# Patient Record
Sex: Female | Born: 1960 | Race: Black or African American | Hispanic: No | Marital: Single | Smoking: Former smoker
Health system: Southern US, Community
[De-identification: ages and names within clinical notes are randomized; demographics above are authoritative.]

## PROBLEM LIST (undated history)

## (undated) DIAGNOSIS — D649 Anemia, unspecified: Secondary | ICD-10-CM

## (undated) DIAGNOSIS — K219 Gastro-esophageal reflux disease without esophagitis: Secondary | ICD-10-CM

## (undated) DIAGNOSIS — G43909 Migraine, unspecified, not intractable, without status migrainosus: Secondary | ICD-10-CM

## (undated) DIAGNOSIS — K5792 Diverticulitis of intestine, part unspecified, without perforation or abscess without bleeding: Secondary | ICD-10-CM

## (undated) DIAGNOSIS — F32A Depression, unspecified: Secondary | ICD-10-CM

## (undated) DIAGNOSIS — M1990 Unspecified osteoarthritis, unspecified site: Secondary | ICD-10-CM

## (undated) DIAGNOSIS — E079 Disorder of thyroid, unspecified: Secondary | ICD-10-CM

## (undated) DIAGNOSIS — I639 Cerebral infarction, unspecified: Secondary | ICD-10-CM

## (undated) DIAGNOSIS — I10 Essential (primary) hypertension: Secondary | ICD-10-CM

## (undated) DIAGNOSIS — F329 Major depressive disorder, single episode, unspecified: Secondary | ICD-10-CM

## (undated) HISTORY — DX: Gastro-esophageal reflux disease without esophagitis: K21.9

## (undated) HISTORY — DX: Migraine, unspecified, not intractable, without status migrainosus: G43.909

## (undated) HISTORY — DX: Essential (primary) hypertension: I10

## (undated) HISTORY — DX: Depression, unspecified: F32.A

## (undated) HISTORY — DX: Diverticulitis of intestine, part unspecified, without perforation or abscess without bleeding: K57.92

## (undated) HISTORY — DX: Cerebral infarction, unspecified: I63.9

## (undated) HISTORY — DX: Major depressive disorder, single episode, unspecified: F32.9

## (undated) HISTORY — DX: Anemia, unspecified: D64.9

## (undated) HISTORY — DX: Disorder of thyroid, unspecified: E07.9

## (undated) HISTORY — PX: NO PAST SURGERIES: SHX2092

---

## 2004-09-06 ENCOUNTER — Inpatient Hospital Stay: Payer: Self-pay | Admitting: Anesthesiology

## 2005-08-26 ENCOUNTER — Emergency Department: Payer: Self-pay | Admitting: Emergency Medicine

## 2005-12-02 ENCOUNTER — Emergency Department: Payer: Self-pay | Admitting: Emergency Medicine

## 2006-08-20 ENCOUNTER — Inpatient Hospital Stay: Payer: Self-pay | Admitting: Internal Medicine

## 2007-04-20 ENCOUNTER — Emergency Department: Payer: Self-pay | Admitting: Emergency Medicine

## 2007-11-06 ENCOUNTER — Other Ambulatory Visit: Payer: Self-pay

## 2007-11-06 ENCOUNTER — Inpatient Hospital Stay: Payer: Self-pay | Admitting: Internal Medicine

## 2008-01-22 ENCOUNTER — Emergency Department: Payer: Self-pay | Admitting: Emergency Medicine

## 2009-10-15 ENCOUNTER — Emergency Department: Payer: Self-pay | Admitting: Emergency Medicine

## 2010-01-31 ENCOUNTER — Inpatient Hospital Stay: Payer: Self-pay | Admitting: Psychiatry

## 2010-02-01 ENCOUNTER — Inpatient Hospital Stay: Payer: Self-pay | Admitting: Internal Medicine

## 2010-06-22 ENCOUNTER — Emergency Department: Payer: Self-pay | Admitting: Emergency Medicine

## 2010-09-02 ENCOUNTER — Inpatient Hospital Stay: Payer: Self-pay | Admitting: Psychiatry

## 2011-01-29 ENCOUNTER — Emergency Department: Payer: Self-pay | Admitting: Emergency Medicine

## 2012-12-08 ENCOUNTER — Emergency Department: Payer: Self-pay | Admitting: Emergency Medicine

## 2012-12-08 LAB — COMPREHENSIVE METABOLIC PANEL
Albumin: 4.3 g/dL (ref 3.4–5.0)
Alkaline Phosphatase: 112 U/L (ref 50–136)
Anion Gap: 11 (ref 7–16)
BUN: 10 mg/dL (ref 7–18)
Calcium, Total: 9.3 mg/dL (ref 8.5–10.1)
Chloride: 106 mmol/L (ref 98–107)
Co2: 23 mmol/L (ref 21–32)
EGFR (African American): >60
Glucose: 99 mg/dL (ref 65–99)
Osmolality: 278 (ref 275–301)
Potassium: 3.5 mmol/L (ref 3.5–5.1)
SGOT(AST): 29 U/L (ref 15–37)
SGPT (ALT): 27 U/L (ref 12–78)
Sodium: 140 mmol/L (ref 136–145)

## 2012-12-08 LAB — URINALYSIS, COMPLETE
Leukocyte Esterase: NEGATIVE
Nitrite: NEGATIVE
Ph: 5 (ref 4.5–8.0)
Protein: NEGATIVE
RBC,UR: 2 /HPF (ref 0–5)
Specific Gravity: 1.004 (ref 1.003–1.030)
Squamous Epithelial: NONE SEEN
WBC UR: 1 /HPF (ref 0–5)

## 2012-12-08 LAB — CBC
HCT: 47.2 % — ABNORMAL HIGH (ref 35.0–47.0)
MCHC: 32.8 g/dL (ref 32.0–36.0)
RDW: 15.5 % — ABNORMAL HIGH (ref 11.5–14.5)
WBC: 7.1 10*3/uL (ref 3.6–11.0)

## 2012-12-08 LAB — ETHANOL: Ethanol %: 0.287 % — ABNORMAL HIGH (ref 0.000–0.080)

## 2012-12-08 LAB — DRUG SCREEN, URINE
Amphetamines, Ur Screen: NEGATIVE (ref ?–1000)
Barbiturates, Ur Screen: NEGATIVE (ref ?–200)
Benzodiazepine, Ur Scrn: NEGATIVE (ref ?–200)
Cocaine Metabolite,Ur ~~LOC~~: POSITIVE (ref ?–300)
MDMA (Ecstasy)Ur Screen: NEGATIVE (ref ?–500)
Opiate, Ur Screen: NEGATIVE (ref ?–300)
Phencyclidine (PCP) Ur S: NEGATIVE (ref ?–25)
Tricyclic, Ur Screen: NEGATIVE (ref ?–1000)

## 2012-12-08 LAB — TSH: Thyroid Stimulating Horm: 4.66 u[IU]/mL — ABNORMAL HIGH

## 2013-05-06 LAB — CBC WITH DIFFERENTIAL/PLATELET
Eosinophil %: 1.6 %
HGB: 15.7 g/dL (ref 12.0–16.0)
MCH: 28.5 pg (ref 26.0–34.0)
MCHC: 34.3 g/dL (ref 32.0–36.0)
Monocyte #: 0.5 x10 3/mm (ref 0.2–0.9)
Monocyte %: 5.8 %
Neutrophil #: 4.4 10*3/uL (ref 1.4–6.5)
Platelet: 299 10*3/uL (ref 150–440)
RBC: 5.51 10*6/uL — ABNORMAL HIGH (ref 3.80–5.20)
RDW: 14 % (ref 11.5–14.5)

## 2013-05-06 LAB — PROTIME-INR
INR: 0.9
Prothrombin Time: 12.5 secs (ref 11.5–14.7)

## 2013-05-07 ENCOUNTER — Inpatient Hospital Stay: Payer: Self-pay | Admitting: Internal Medicine

## 2013-05-07 DIAGNOSIS — I6350 Cerebral infarction due to unspecified occlusion or stenosis of unspecified cerebral artery: Secondary | ICD-10-CM

## 2013-05-07 LAB — URINALYSIS, COMPLETE
Bacteria: NONE SEEN
Bilirubin,UR: NEGATIVE
Glucose,UR: NEGATIVE mg/dL (ref 0–75)
Leukocyte Esterase: NEGATIVE
Nitrite: NEGATIVE
Protein: NEGATIVE
RBC,UR: 3 /HPF (ref 0–5)
Specific Gravity: 1.012 (ref 1.003–1.030)
Squamous Epithelial: 1
WBC UR: 5 /HPF (ref 0–5)

## 2013-05-07 LAB — COMPREHENSIVE METABOLIC PANEL
Albumin: 3.9 g/dL (ref 3.4–5.0)
Anion Gap: 7 (ref 7–16)
BUN: 9 mg/dL (ref 7–18)
Bilirubin,Total: 0.5 mg/dL (ref 0.2–1.0)
Calcium, Total: 9.1 mg/dL (ref 8.5–10.1)
Co2: 28 mmol/L (ref 21–32)
EGFR (African American): >60
EGFR (Non-African Amer.): >60
Glucose: 101 mg/dL — ABNORMAL HIGH (ref 65–99)
Osmolality: 278 (ref 275–301)
Potassium: 3.1 mmol/L — ABNORMAL LOW (ref 3.5–5.1)

## 2013-05-07 LAB — DRUG SCREEN, URINE
Amphetamines, Ur Screen: NEGATIVE (ref ?–1000)
Benzodiazepine, Ur Scrn: NEGATIVE (ref ?–200)
MDMA (Ecstasy)Ur Screen: NEGATIVE (ref ?–500)
Methadone, Ur Screen: NEGATIVE (ref ?–300)
Opiate, Ur Screen: NEGATIVE (ref ?–300)

## 2013-05-07 LAB — SALICYLATE LEVEL: Salicylates, Serum: 2.1 mg/dL

## 2013-05-07 LAB — ETHANOL: Ethanol: 109 mg/dL

## 2013-05-08 ENCOUNTER — Inpatient Hospital Stay: Payer: Self-pay | Admitting: Psychiatry

## 2013-05-08 LAB — LIPID PANEL: Triglycerides: 246 mg/dL — ABNORMAL HIGH (ref 0–200)

## 2013-05-08 LAB — BASIC METABOLIC PANEL
Co2: 26 mmol/L (ref 21–32)
Creatinine: 0.6 mg/dL (ref 0.60–1.30)
EGFR (African American): >60
EGFR (Non-African Amer.): >60
Sodium: 140 mmol/L (ref 136–145)

## 2013-05-08 LAB — CBC WITH DIFFERENTIAL/PLATELET
Basophil #: 0 10*3/uL (ref 0.0–0.1)
Eosinophil %: 1.7 %
HCT: 38.8 % (ref 35.0–47.0)
HGB: 13.1 g/dL (ref 12.0–16.0)
Lymphocyte #: 1.7 10*3/uL (ref 1.0–3.6)
Lymphocyte %: 32.2 %
MCH: 28.2 pg (ref 26.0–34.0)
MCV: 84 fL (ref 80–100)
Monocyte #: 0.4 x10 3/mm (ref 0.2–0.9)
Neutrophil %: 57.3 %
RBC: 4.64 10*6/uL (ref 3.80–5.20)
RDW: 13.9 % (ref 11.5–14.5)

## 2013-10-31 ENCOUNTER — Inpatient Hospital Stay: Payer: Self-pay | Admitting: Internal Medicine

## 2013-10-31 LAB — URINALYSIS, COMPLETE
BLOOD: NEGATIVE
Bilirubin,UR: NEGATIVE
GLUCOSE, UR: NEGATIVE mg/dL (ref 0–75)
Ketone: NEGATIVE
NITRITE: NEGATIVE
PH: 5 (ref 4.5–8.0)
RBC,UR: 1 /HPF (ref 0–5)
Specific Gravity: 1.012 (ref 1.003–1.030)
WBC UR: 30 /HPF (ref 0–5)

## 2013-10-31 LAB — CBC
HCT: 42.7 % (ref 35.0–47.0)
HGB: 14.3 g/dL (ref 12.0–16.0)
MCH: 27.7 pg (ref 26.0–34.0)
MCHC: 33.5 g/dL (ref 32.0–36.0)
MCV: 83 fL (ref 80–100)
Platelet: 223 10*3/uL (ref 150–440)
RBC: 5.15 10*6/uL (ref 3.80–5.20)
RDW: 14.4 % (ref 11.5–14.5)
WBC: 11.4 10*3/uL — ABNORMAL HIGH (ref 3.6–11.0)

## 2013-10-31 LAB — COMPREHENSIVE METABOLIC PANEL
Albumin: 3.3 g/dL — ABNORMAL LOW (ref 3.4–5.0)
Alkaline Phosphatase: 117 U/L
Anion Gap: 8 (ref 7–16)
BUN: 10 mg/dL (ref 7–18)
Bilirubin,Total: 1.2 mg/dL — ABNORMAL HIGH (ref 0.2–1.0)
CALCIUM: 9.1 mg/dL (ref 8.5–10.1)
Chloride: 102 mmol/L (ref 98–107)
Co2: 28 mmol/L (ref 21–32)
Creatinine: 0.81 mg/dL (ref 0.60–1.30)
EGFR (African American): >60
Glucose: 115 mg/dL — ABNORMAL HIGH (ref 65–99)
OSMOLALITY: 276 (ref 275–301)
Potassium: 3 mmol/L — ABNORMAL LOW (ref 3.5–5.1)
SGOT(AST): 22 U/L (ref 15–37)
SGPT (ALT): 19 U/L (ref 12–78)
Sodium: 138 mmol/L (ref 136–145)
Total Protein: 7.3 g/dL (ref 6.4–8.2)

## 2013-10-31 LAB — LIPASE, BLOOD: Lipase: 201 U/L (ref 73–393)

## 2013-10-31 LAB — TROPONIN I: Troponin-I: <0.02 ng/mL

## 2013-11-01 LAB — CBC WITH DIFFERENTIAL/PLATELET
BASOS PCT: 0.6 %
Basophil #: 0 10*3/uL (ref 0.0–0.1)
Eosinophil #: 0.1 10*3/uL (ref 0.0–0.7)
Eosinophil %: 1.4 %
HCT: 40.7 % (ref 35.0–47.0)
HGB: 13.3 g/dL (ref 12.0–16.0)
LYMPHS PCT: 20.5 %
Lymphocyte #: 1.5 10*3/uL (ref 1.0–3.6)
MCH: 27.3 pg (ref 26.0–34.0)
MCHC: 32.6 g/dL (ref 32.0–36.0)
MCV: 84 fL (ref 80–100)
MONOS PCT: 7.3 %
Monocyte #: 0.5 x10 3/mm (ref 0.2–0.9)
Neutrophil #: 5.2 10*3/uL (ref 1.4–6.5)
Neutrophil %: 70.2 %
PLATELETS: 239 10*3/uL (ref 150–440)
RBC: 4.86 10*6/uL (ref 3.80–5.20)
RDW: 14.5 % (ref 11.5–14.5)
WBC: 7.4 10*3/uL (ref 3.6–11.0)

## 2013-11-01 LAB — URINE CULTURE

## 2014-01-01 DIAGNOSIS — I10 Essential (primary) hypertension: Secondary | ICD-10-CM | POA: Insufficient documentation

## 2014-01-16 ENCOUNTER — Ambulatory Visit: Payer: Self-pay

## 2014-01-16 DIAGNOSIS — R079 Chest pain, unspecified: Secondary | ICD-10-CM

## 2014-07-02 LAB — CBC AND DIFFERENTIAL
HCT: 40 % (ref 36–46)
Hemoglobin: 14.4 g/dL (ref 12.0–16.0)
Neutrophils Absolute: 4 /uL
Platelets: 264 10*3/uL (ref 150–399)
WBC: 6.6 10^3/mL

## 2014-07-02 LAB — LIPID PANEL
Cholesterol: 187 mg/dL (ref 0–200)
HDL: 50 mg/dL (ref 35–70)
Triglycerides: 401 mg/dL — AB (ref 40–160)

## 2014-09-05 DIAGNOSIS — G43909 Migraine, unspecified, not intractable, without status migrainosus: Secondary | ICD-10-CM | POA: Insufficient documentation

## 2014-10-18 NOTE — H&P (Signed)
PATIENT NAME:  Meredith Juarez, SACHS MR#:  [id number] DATE OF BIRTH:  1960/08/02  DATE OF ADMISSION:  05/07/2013  REFERRING PHYSICIAN: Dr.   PRIMARY CARE PHYSICIAN: None.   CHIEF COMPLAINT: Headache, not feeling well.   HISTORY OF PRESENT ILLNESS: This is a 54 year old female with significant past medical history of heavy alcohol abuse in the past, chronic anemia, tobacco abuse, who presents with above-mentioned complaints. The patient presents with alcohol intoxication, reports she drinks 40 ounces beer x 3 today. Reports her sister died recently, but she denies any suicidal thoughts or ideation, as well, she denies any heavy alcohol use. Reports she did not drink any alcohol for the last few months, even though she has history of alcohol abuse in the past and substance abuse, but she denies any alcohol or substance abuse over the last few months. The patient presents with complaints of posterior headache, blurry vision, weakness and dizziness. The patient can't give  reliable history and cannot tell exactly when is the onset of her symptoms. The patient was found to have right side weakness with the physical exam by ED physician to right upper and lower extremity. Motor strength five out of five with decreased sensation in the right lower extremity. The patient had CT head and CT head angiograms to rule out any acute CVA or  aneurysm. They did not show any acute findings. The patient denies any chest pain, any shortness of breath, any dysuria, polyuria, fever or chills. Reports she has not been seen by any physician. She is not taking any medications at home. Her blood pressure was on the higher side upon presentation,  systolic running in the 045W to 180s. Hospitalist service was requested to admit the patient for further management and work-up of the patient. The patient's blood work was significant for mild hypokalemia.   PAST MEDICAL HISTORY: 1. Tobacco abuse.  2. Alcohol intoxication.  3. Chronic  anemia.   PAST SURGICAL HISTORY: None.   FAMILY HISTORY: Mother had cancer, not sure what type. Father had coronary artery disease. Brother has schizophrenia, reports her sister who died, she is adopted and she does not know why she died.   SOCIAL HISTORY: The patient reports she is smoking 4 to 5 cigarettes every day, 1 pack last her every three days. Reports history of occasional alcohol use, but not regular. Reports the last drink she had prior to today was a few months ago, as well, denies any substance abuse.   HOME MEDICATIONS: The patient does not take any medications at home, does not follow with any primary care physician.   ALLERGIES: No known drug allergies.   REVIEW OF SYSTEMS:  CONSTITUTIONAL: The patient denies, fever or chills, weight loss, weight gain.  EYES: Denies double vision, inflammation, glaucoma, reports blurry vision.  EARS, NOSE, THROAT: Denies tinnitus, ear pain, hearing loss, epistaxis.  RESPIRATORY: Denies cough, wheezing, hemoptysis, dyspnea.  CARDIOVASCULAR: Denies chest pain, edema, arrhythmia, syncope.  GASTROINTESTINAL: Denies nausea, vomiting, diarrhea, abdominal pain, hematemesis.  GENITOURINARY: Denies dysuria, hematuria, renal colic.  ENDOCRINE: Denies polyuria, polydipsia, heat or cold intolerance.  HEMATOLOGY: Denies anemia, easy bruising, bleeding diathesis.  INTEGUMENT: Denies acne, rash or effusion.  MUSCULOSKELETAL: Denies any gout, cramps or arthritis.  NEUROLOGICAL: Denies any history of CVA, transient ischemic attack, seizures, memory loss, dementia, ataxia, vertigo. In the past, reports history of posterior headache, as well, right elbow and lower side weakness.  PSYCHIATRIC: Denies any suicidal thoughts any anxiety, insomnia or depression.   PHYSICAL EXAMINATION: VITAL SIGNS:  Temperature 98.3, pulse 86, respiratory rate 17, blood pressure 182/101, saturating 96% on room air.  GENERAL: Well-nourished female who looks comfortable in bed, in  no apparent distress.  HEENT: Head atraumatic, normocephalic. Pupils equal, reactive to light. Pink conjunctivae. Anicteric sclerae. Moist oral mucosa.  NECK: Supple. No thyromegaly. No JVD.  CHEST: Good air entry bilaterally. No wheezing, rales or rhonchi.   CARDIOVASCULAR: S1, S2 heard. No rubs, murmur or gallops.  ABDOMEN: Soft, nontender, nondistended. Bowel sounds present.  EXTREMITIES: She is intoxicated, but awake, alert x 3 and conversant.  NEUROLOGIC: Cranial nerves grossly intact. No nystagmus appreciated. Motor right upper and lower extremity, 4/5. Left 5/5. Sensation decreased in the right lower extremity. Reflexes are symmetric and +2 bilaterally.  LYMPHATICS: No cervical or supraclavicular lymphadenopathy.   PERTINENT LABORATORY DATA: Glucose 101, BUN 9, creatinine 0.83, sodium 140, potassium 3.1, chloride 105, CO2 28, ALT 29, AST 36, alkaline phosphatase 133, TSH 4.8. Troponin less than 0.02. Urine drugs negative. Ethanol percentage 0.109.   White blood cells 7.8, hemoglobin 15.7, hematocrit 45.8, platelets 199. Urinalysis negative for leukocyte esterase and nitrite.   Imaging: CT angiography head: No high-grade flow limiting stenosis or intracranial aneurysm identified. CT head without contrast showing normal examination unchanged.   ASSESSMENT AND PLAN: 1. Cerebrovascular accident. The patient who presents with right-sided weakness, she will be admitted for a cerebrovascular accident and work-up. She already received 324 of aspirin. We will check MRI brain in the morning, carotid Dopplers, 2-D echo. Admit to telemetry unit. Start subcutaneous heparin for deep vein thrombosis prophylaxis and physical therapy consult. She will be continued on 324 aspirin daily. We will check a lipid panel and consult neurology service. As well, for this patient we will allow for permissive hypertension. Systolic blood pressure can remain higher than 200 for the first 24 hours.  2. Hypokalemia. We  will replace. We will recheck tomorrow.  3. Tobacco abuse. The patient was counseled. Will be started on NicoDerm patches.  4. Alcohol intoxication. The patient reports she does not use alcohol chronically, last time before a few month, so doubt she will have any withdrawel , but she will be monitored closely for that.  5. Deep vein thrombosis prophylaxis. Subcutaneous heparin.  6. Diet. The patient will be assessed by ED nurse for swallow and if she passes we will resume her on diet.  7. CODE STATUS: Full code.   TOTAL TIME SPENT FOR ADMISSION AND PATIENT CARE: 55 minutes.    ____________________________ Albertine Patricia, MD dse:sg D: 05/07/2013 05:08:00 ET T: 05/07/2013 06:16:26 ET JOB#: [id number]  cc: Albertine Patricia, MD, <Dictator> Lennart Gladish Graciela Husbands MD ELECTRONICALLY SIGNED 06/03/2013 2:32

## 2014-10-18 NOTE — Consult Note (Signed)
PATIENT NAMELATRESHA, YAHR [id number] OF BIRTH:  1961/05/09 OF ADMISSION:  11/10/2014OF CONSULTATION:  05/07/2013 PHYSICIAN: Dr. Emeline Gins Elgergawy  ACONSULTINGTTENDING PHYSICIAN: Dr. Orson Slick  FOR CONSULTATION: To assess a patient with alcoholism.  DATA: [name] is a 54 year old female with history of alcoholism and mood instability.   COMPLAINT: "I am depressed. "   HISTORY OF PRESENT ILLNESS: Ms.Meredith Juarez was admitted for right sided weakness with suspicion of CVA. He has a history of depression and alcoholism. Her blood alcohol level was elevated on admission. She was placed on the CIWA protocol. The extend of her drinking is unknown. She reports being sober for several months until 3 days ago when she relapsed on beer drinking 40 oz beers for the past 3 days. She has been more depressed lately as she just lost her adoptive sister with whom she was very close. She has a history of noncompliance with medications and doctors appointments. She reports poor sleep, decreased appetite, low energy, crying, anhedonia, social isolation, feelings of guilt, hopelessness, helplessness, poor memory and concentration. She has been having passing suicidal thoughts since her sister passed away.  There is no psychotic symptoms or symptoms suggestive of bipolar mania. There are no other than alcohol substances involved. PSYCHIATRIC HISTORY: She was diagnosed with depression in 20111. There was a suicide attempt by painkiller overdose in 2012. She has been drinking since. Twice this year she was admitted to [facility] for alcohol treatment.    PSYCHIATRIC HISTORY: Brother with schizophrenia who died of an overdose.   MEDICAL HISTORY: Hypertension, CVA.  ON ADMISSION: None.  HISTORY: She has not been employed since 2011. She worked for a [employer] and was let go after a work-related injury when a roof fell on top of her on the job.  She lives with her mother and does not feel that this is a good arrangement.  She has never been married.  OF SYSTEMS: No fevers or chills. Positive for weight loss. No double or blurred vision. No hearing loss. No shortness of breath or cough. No chest pain or orthopnea. No abdominal pain, nausea, vomiting, or diarrhea. No incontinence or frequency. No heat or cold intolerance. No anemia or easy bruising. No acne or rash. No muscle or joint pain. No tingling or weakness. See history of present illness for details.  EXAMINATION: SIGNS: Blood pressure 144/89, pulse 75, respirations 18, temperature 98.2. GENERAL: This is an obese female in no acute distress. The rest of the physical examibation is deferred to her primary attending.   DATA: Chemistries are within normal limits except for potassium of 3.1. Blood alcohol level is 0.109. LFTs within normal limits. TSH 4.8. UDS is negative for substances. CBC within normal limits. Urinalysis is not suggestive of urinary tract infection.   STATUS EXAMINATION: The patient is alert and oriented to person, place, time, and situation. She is pleasant, polite and cooperative. She is fairly groomed. She maintains good eye contact. Her speech is slow. Mood is depressed with flat affect. Thought process is logical. Thought content: She denies thoughts of hurting herselfor others. There are no delusions or paranoia. There are no auditory or visual hallucinations. Her cognition is grossly intact. Her insight and judgment are poor. I:   Major depressive disorder, recurrent, severe.  Alcohol abuse. AXIS II:  Deferred. III:  HTN, CVA, Status post work-related injury. IV:  Mental illness, substance abuse, primary support, employment, financial, housing. V:  GAF on admission 35.   1. Please continue CIWA protocol. Will start Trazodone  for sleep. She may need transfer to psychiatry once medically cleared.     Electronic Signatures: Orson Slick (MD) (Signed on 11-Nov-14 05:23)  Authored   Last Updated: 12-Nov-14 04:56 by Orson Slick (MD)

## 2014-10-18 NOTE — Consult Note (Signed)
Brief Consult Note: Diagnosis: Mood disorder NOS, alcohol abuse.   Patient was seen by consultant.   Consult note dictated.   Recommend further assessment or treatment.   Orders entered.   Comments: Ms. Swartzentruber has a h/o depression and alcoholism. She denies steady drinking. In the past she was treated with a mood stabilizer. She denies suicidal or homicidal ideation.She has been depressed lately due to multiple loses here for CVA.  PLAN: 1. Please continue CIWA protocol.  2. Will start Trazodone for sleep.  3. She may need transfer to psychiatry once medically cleared.  Electronic Signatures: Orson Slick (MD)  (Signed [phone] 18:04)  Authored: Brief Consult Note   Last Updated: 10-Nov-14 18:04 by Orson Slick (MD)

## 2014-10-18 NOTE — Discharge Summary (Signed)
PATIENT NAME:  ENA, DEMARY MR#:  [id number] DATE OF BIRTH:  1961/04/19  DATE OF ADMISSION:  05/07/2013 DATE OF DISCHARGE:  05/08/2013  PRIMARY CARE PHYSICIAN:  Nonlocal.  CONSULTING PHYSICIAN:  Dr. Manuella Ghazi, neurology; Dr. Bary Leriche, behavior medicine physician.    DISCHARGE DIAGNOSES:  Transient ischemic attack, hypertension, obesity, major depression and suicidal ideation.   CONDITION: Stable.   CODE STATUS: Full code.   HOME MEDICATIONS: Please refer to the medication reconciliation list. Zocor 40 mg p.o. at bedtime, aspirin 81 mg p.o. daily, nicotine patch 21 mg per 24 hour transdermal film 1 patch transdermal once a day, HCTZ 25 mg p.o. daily.   DIET: Low sodium, low fat, low cholesterol diet.   ACTIVITY: As tolerated.   FOLLOWUP CARE: The patient will be discharged to Behavior Medicine Unit today.   REASON FOR ADMISSION: Headache, not feeling well.   HOSPITAL COURSE: The patient is a 54 year old African American female with a history of heavy alcohol abuse, chronic anemia, tobacco abuse, presented in the ED with headache and not feeling well. In addition, the patient complains of right-sided weakness and decreased sensation, but CAT scan of head and angiogram did not show any acute process. The patient's blood pressure was 160 to 180s. For detailed history and physical examination, please refer to the admission note dictated by Dr. Waldron Labs. Laboratory data on admission date showed WBC 7.8, hemoglobin 15.7, platelets 199. Urinalysis negative  The patient was admitted for CVA due to this right-sided weakness. After admission, the patient still complains of right-sided weakness. The patient has been treated with aspirin 325 mg p.o. daily with a statin. The patient was not given hypertension medication since systolic blood pressure should be maintained higher. The patient got a carotid duplex and echocardiograph which were normal. In addition, the patient got an MRI of the brain which  did not show any acute stroke. The patient's right-sided weakness has been much better today. The patient only complains of headache. Since the patient's blood pressure is elevated at 160s, we gave hypertension medication.  Blood pressure decreased to normal range. This morning, the patient has complaints of suicidal ideations, so she is put on one-to-one seizure. Dr. Bary Leriche evaluated the patient yesterday, suggesting the patient has major depression and needs transfer to Behavior Medicine Unit if the patient is medically stable. Since the patient has no evidence of acute CVA, her blood pressure is under control, she is clinically stable and will be discharged to Behavior Medicine Unit today. I discussed the patient's discharge plan with the patient, nurse and case manager.   TIME SPENT: About 38 minutes.   ____________________________ Demetrios Loll, MD qc:cs D: 05/08/2013 13:16:27 ET T: 05/08/2013 14:06:21 ET JOB#: [id number]  cc: Demetrios Loll, MD, <Dictator> Demetrios Loll MD ELECTRONICALLY SIGNED 05/10/2013 16:02

## 2014-10-18 NOTE — Consult Note (Signed)
Brief Consult Note: Diagnosis: Mood disorder NOS, alcohol abuse.   Patient was seen by consultant.   Consult note dictated.   Recommend further assessment or treatment.   Orders entered.   Comments: [name] has a h/o depression and alcoholism. She denies steady drinking. In the past she was treated with a mood stabilizer. She denies suicidal or homicidal ideation.She has been depressed lately due to multiple loses here for CVA.  PLAN: 1. Transfer to psychiatry.  2. Please continue CIWA protocol.  3. Will start Trazodone for sleep.  Electronic Signatures: Orson Slick (MD)  (Signed 11-Nov-14 11:17)  Authored: Brief Consult Note   Last Updated: 11-Nov-14 11:17 by Orson Slick (MD)

## 2014-10-18 NOTE — Consult Note (Signed)
PATIENT NAME:  Meredith Juarez, Meredith Juarez MR#:  [id number] DATE OF BIRTH:  1960/08/06  DATE OF CONSULTATION:  05/07/2013  REFERRING PHYSICIAN:  Albertine Patricia, MD CONSULTING PHYSICIAN:  Meredith Noguera K. Manuella Ghazi, MD  REASON FOR CONSULTATION: Concern for stroke, right-sided weakness.   HISTORY OF PRESENT ILLNESS: Meredith Juarez is a 54 year old right-handed African American female who had subacute onset of right-sided weakness since around 05/06/2013. The patient could not tell me exactly what time of the day her weakness started. The patient came to the ER due to the high blood pressure and possible headache into the back of her head.   The patient also carries the diagnosis of alcoholism, mentioned she had a recent death in her family and was drinking very heavily. She noted to have high alcohol level during her ER visit. The ER physician noted that her subjective complaint of right-sided weakness was very hard to prove on objective exam.   PAST MEDICAL HISTORY: Significant for tobacco abuse, alcoholism and chronic anemia.   PAST SURGICAL HISTORY: Negative.   FAMILY HISTORY: Positive for her mother had cancer. Father had coronary artery disease. Brother has schizophrenia. Sister has died.   SOCIAL HISTORY: Significant that she smokes 4 to 5 cigarettes a day. She has intermittent heavy alcohol use. She is currently unemployed.   MEDICATIONS:  She does not take any medications at home.   ALLERGIES:  She does not have any known drug allergies.   REVIEW OF SYSTEMS: Positive for feeling of pain and numbness in the right upper and lower extremities as well as pain with passive movement of her arm and legs. She also has a feeling of weakness on her arm and leg. The other 10-system review of system was asked and was found to be negative except feeling of anxiety and depression.     PHYSICAL EXAMINATION: VITAL SIGNS: Temperature 98.5, pulse 77, respiratory rate 18, blood pressure 165/100, pulse ox 97% on room air.   GENERAL: She is a middle-aged [place] American female lying in bed, not in acute distress, watching TV.  LUNGS: Clear to auscultation.  HEART: S1, S2 heart sounds. Carotid exam did not reveal any bruit.  EYES:  Funduscopic exam was attempted.  ABDOMEN:  Her belly is soft.  MENTAL STATUS EXAM: She was alert, oriented followed 2-step commands. She was shy and she had poor eye contact. She said her mood is okay. She seemed a little bit upset while turning off the TV. The patient's language seems to be intact. She does not have any neurological neglect. Her attention and concentration seems to be appropriate as well as fund of knowledge.  NEUROLOGICAL EXAMINATION:  Cranial nerves: Her pupils are equal, round and reactive. Extraocular movements are intact. Her visual fields seem full. Her face was symmetric. Facial sensations were intact. Her tongue was midline. Palate was upgoing. Her hearing was intact. Her neck and shoulder shrug seems to be okay. On her motor exam, the patient has pain with passive movement of her right upper and lower extremity. She did some resistance to passive movement. It was hard to check pronator drift as she will not pronate her right arm really well and it was painful when I tried to rotate it passively. The patient does have poor effort on her right upper and lower extremity but when did the spontaneous movement of her arm it did move really well. I did not see any change in the tone on her right upper and lower extremity. Her sensations were also decreased  in the right upper and lower extremities. Reflexes were symmetric. I could not check her gait as patient felt uncomfortable getting up.     ASSESSMENT AND PLAN: 1.  Right-sided upper and lower extremity weakness, concern for ischemic infarct with negative CT scan of the head as well as CT angiogram. I agree with doing MRI of the brain without contrast looking for cytotoxic edema. 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 The patient is going through a significant rough time with a family member's death as well as alcoholism, etc. In either case, physical therapy does help with right-sided weakness so should be pursued. The patient was not a candidate for tPA as the time of onset was not very clear. The patient had a swallow evaluation. The patient should be continued on aspirin and statin. I talked to the patient about the risk factors of stroke including uncontrolled high blood pressure, alcoholism, hyperlipidemia, etc. The patient should keep an eye open for obstructive sleep apnea. The patient is being monitored to make sure she does not have paroxysmal atrial fibrillation. Also check her A1c. I reviewed her carotid Doppler which has been unremarkable as well as her echo showed concentric hypertrophy and some diastolic dysfunction.  2.  Alcoholism per psychiatry.   Feel free to contact me with any further questions. I will follow this patient with you.   ____________________________ Meredith Ryden K. Manuella Ghazi, MD hks:cs D: 05/07/2013 20:14:52 ET T: 05/07/2013 20:46:49 ET JOB#: [id number]  cc: Meredith Wierenga K. Manuella Ghazi, MD, <Dictator> Meredith Juarez [facility] MD ELECTRONICALLY SIGNED 05/11/2013 12:34

## 2014-10-19 ENCOUNTER — Emergency Department
Admit: 2014-10-19 | Disposition: A | Discharge status: Home / Self Care | Payer: Self-pay | Admitting: Emergency Medicine

## 2014-10-19 NOTE — H&P (Signed)
PATIENT NAME:  Meredith Juarez, Meredith Juarez MR#:  [id number] DATE OF BIRTH:  25-Jun-1961  DATE OF ADMISSION:  10/31/2013  PRIMARY CARE PHYSICIAN: None.  CHIEF COMPLAINT: Left lower abdominal pain for three days.   HISTORY OF PRESENT ILLNESS: Meredith Juarez is a 54 year old African American female with past medical history of cardiomyopathy, history of menorrhagia, PTSD, major depression with psychosis, comes to the Emergency Room after she started having discomfort and pain in her left lower abdomen for the last three days. She has history of constipation in the past however, has regular bowel movement every day without any bloody stools. She denies any vomiting, fever. The patient also had presyncopal episode likely due to her abdominal pain prior to arrival. She is hemodynamically stable. Tells me her blood pressure is very labile. She does not follow any primary care physician on a regular basis due to insurance reasons. She takes hydrochlorothiazide; however, is running out of her medication for the last three several days. Work-up in the Emergency Room showed she has acute sigmoid diverticulitis. She received IV Cipro and p.o. Flagyl. She is being admitted for further evaluation and management. White count is 11,000.   PAST MEDICAL HISTORY: 1. Major depression with psychosis.  2. PTSD.  3. History of fibroid uterus with menorrhagia leading to anemia.  4. Recto/anal hemorrhage with GI bleed.  5. History of cardiomyopathy.   MEDICATIONS: The patient takes:  1. Seroquel 50 mg at bedtime.  2. Prozac 20 mg daily.  3. Topiramate 100 mg p.o. daily.  4. Aspirin 81 mg daily.  5. Hydrochlorothiazide 25 mg p.o. daily.   ALLERGIES: No known drug allergies.   SOCIAL HISTORY: Smokes every now and then. She denies any alcohol or any drug use. Lives with a friend. She has applied for disability.   FAMILY HISTORY: Mother had cancer, not sure what type. Father had coronary artery disease. Brother has schizophrenia.    REVIEW OF SYSTEMS:  CONSTITUTIONAL: No fever, fatigue or weakness.  EYES: No blurred or double vision, glaucoma or cataracts.  ENT: Denies tinnitus, ear pain, hearing loss, epistaxis.  RESPIRATORY: No cough, wheeze, hemoptysis, dyspnea.  CARDIOVASCULAR: No chest pain, orthopnea, edema.  GASTROINTESTINAL: No nausea, vomiting. Positive for abdominal pain. No melena or rectal bleed.  GENITOURINARY: No dysuria, hematuria, renal colic.  ENDOCRINE: No polyuria, nocturia, polydipsia, heat or cold intolerance.  HEMATOLOGY: No easy bruising or bleeding disorder. No anemia.  SKIN: No acne, rash or any lesions.  MUSCULOSKELETAL: Positive for back pain. No gout or swelling of joints.  NEUROLOGIC: No CVA, TIA, seizures, dementia. Positive for migraine headaches in the past.  PSYCHIATRIC: Positive for major depression. Denies any suicidal thoughts. All other systems reviewed and negative.   PHYSICAL EXAMINATION: GENERAL: The patient is awake, alert, oriented x 3, not in acute distress.  VITAL SIGNS: Afebrile. Pulse is 91, blood pressure is 168/94, sats are 99% on room air.  HEENT: Atraumatic, normocephalic. Pupils: PERRLA. EOM intact. Oral mucosa is moist.  NECK: Supple. No JVD. No carotid bruit.  LUNGS: Clear to auscultation bilaterally. No rales, rhonchi, respiratory distress or labored breathing.  HEART: Both the heart sounds are normal. Tachycardia present. No murmur heard. PMI not lateralized. Chest is nontender.  EXTREMITIES: Good pedal pulses, good femoral pulses. No lower extremity edema.  ABDOMEN: Soft. There is mild tenderness in the right lower quadrant abdomen. No guarding, rigidity or any mass felt. Good  bowel sounds.  NEUROLOGIC: Grossly intact cranial nerves II through XII. No motor or sensory deficit.  PSYCHIATRIC: The patient is awake, alert, oriented x 3.   CT of the abdomen and pelvis shows sigmoid diverticulitis. No perforation or abscess. multi fibroid uterus.   Ptotic left  kidney.   Urinalysis positive for mild urinary tract infection. White count is 11.4.   Electrolytes within normal limits except potassium of 3.0. Troponin is 0.02. EKG shows normal sinus rhythm.   ASSESSMENT: A 54 year old Meredith Juarez with history of major depression with psychosis, hypertension, history of headaches, comes to the Emergency Room after she had presyncopal episode suspected secondary to abdominal pain. She was found to have:   1. Acute sigmoid diverticulitis. We will keep patient on clear liquid diet. Give her IV fluids. Continue Cipro, Flagyl. Advance diet as tolerated.  2. Hypertension. Continue hydrochlorothiazide. Monitor blood pressure and add other antihypertensives as needed.  3. History of major depression with psychosis. The patient is on Prozac, trazodone and Seroquel which were resumed.  4. History of headaches. The patient takes Topamax 100 mg at bedtime.  5. Deep vein thrombosis prophylaxis. Subcutaneous heparin t.i.d.  6. Further work-up per the patient's clinical course.   Hospital admission plan was discussed with the patient. No family members present.   TIME SPENT: 45 minutes.   ____________________________ Meredith Rochester Posey Pronto, MD sap:sg D: 10/31/2013 07:52:22 ET T: 10/31/2013 11:05:03 ET JOB#: [id number]  cc: Meredith Fulbright A. Posey Pronto, MD, <Dictator>  Meredith Basset MD ELECTRONICALLY SIGNED 11/18/2013 16:16

## 2014-10-19 NOTE — Discharge Summary (Signed)
PATIENT NAME:  Meredith, Juarez MR#:  [id number] DATE OF BIRTH:  24-Jul-1960  DATE OF ADMISSION:  10/31/2013 DATE OF DISCHARGE:  11/01/2013  PRESENTING COMPLAINT: Abdominal pain.   DISCHARGE DIAGNOSES: 1.  Acute sigmoid diverticulitis.  2.  Hypertension.  3.  Depression.   CONDITION ON DISCHARGE: Fair.   MEDICATIONS: 1.  Topamax 100 mg at bedtime.  2.  Aspirin 81 mg daily.  3.  Hydrochlorothiazide 25 mg daily.  4.  Prozac 20 mg daily.  5.  Trazodone 100 mg t.i.d.  6.  Seroquel XR 150 extended release p.o. daily.  7.  Hydrochlorothiazide 50 mg daily.  8.  Flagyl 500 mg t.i.d.  9.  Cipro 500 b.i.d.  10.  Tylenol 325 mg 2 tablets every 4 hours as needed.  11.  Acetaminophen/hydrocodone  1 every 6 hours as needed.   Follow up with [facility].   LABORATORY DATA:  White count at discharge was 7.4.   BRIEF SUMMARY OF HOSPITAL COURSE:  Meredith Juarez is a 54 year old African American female with history of hypertension and depression, who comes in to the Emergency Room with complaints of abdominal pain. She was admitted with acute sigmoid diverticulitis as seen on CT of the abdomen. She was kept on clear liquids, continued her Cipro and Flagyl. Her white count was 11,000 on admission; that came down to 7000 at discharge. The patient was asked to take full liquid diet for a couple of days and then soft diet thereafter.  2.  Hypertension. Was placed on hydrochlorothiazide and beta blockers.  3. History of depression with major psychosis. The patient was asked to continue her Prozac, trazodone and Seroquel.    4.  History of chronic headaches; takes Topamax for prevention.  5.  Deep vein thrombosis prophylaxis with subQ heparin.   Hospital stay otherwise remained stable.   The patient remained a FULL CODE.   TIME SPENT:  40 minutes.   ____________________________ Hart Rochester Posey Pronto, MD sap:dmm D: 11/02/2013 07:20:32 ET T: 11/02/2013 10:08:51 ET JOB#: [id number]  cc: Ansel Ferrall A. Posey Pronto, MD,  <Dictator> Ilda Basset MD ELECTRONICALLY SIGNED 11/18/2013 16:16

## 2014-11-05 NOTE — H&P (Signed)
PATIENT NAME:  Meredith, Juarez [id number] OF BIRTH:  Aug 17, 1960 OF ADMISSION:  05/08/2013 PHYSICIAN: Dr. Emeline Gins Elgergawy PHYSICIAN: Dr. Orson Slick  DATA: Meredith Juarez is a 54 year old female with a history of alcoholism and mood instability.   COMPLAINT: "Why should I live when everybody around me is dying."   HISTORY OF PRESENT ILLNESS: Meredith Juarez was briefly hospitalized on medical floor for right sided weakness with suspicion of CVA that was ruled out based on MRI. Her blood alcohol level on admission was elevated and, given history of alcoholism, she was started on the CIWA protocol.  At the time of discharge from medicine, the patient reported suicidal thoughts and was transferred to psychiatry to complete alcohol detox and treat depression. Meredith Juarez experienced several major loses lately including the death of her adoptive sister, with whom she was very close, just few days ago. She reports poor sleep, decreased appetite, low energy, crying, anhedonia, social isolation, feelings of guilt, hopelessness, helplessness, poor memory and concentration and frequent suicidal thoughts since her sister passed away. She relapsed on alcohol and has been drinking 40 oz beers prior to admission. Her blood alcohol level was 0.109. She denies psychotic symptoms or symptoms suggestive of bipolar mania. She denies illicit drugs or prescription pill abuse.  PSYCHIATRIC HISTORY: She was diagnosed with depression in 2011. There was a suicide attempt by painkiller overdose in 2012. She was tried on several antidepressants and Tegretol but has been treatment noncompliant due to expense concurrent alcohol use. There is a long history of drinking that escalated after 2012. Twice this year she was admitted to [facility] for alcohol detox.     PSYCHIATRIC HISTORY: Brother with schizophrenia who died of an overdose.   MEDICAL HISTORY: Hypertension, CVA.  ON ADMISSION: ASA 81 mg, HCTZ 25 mg daily, Zocor 40 mg daily.   HISTORY:  She has not been employed since 2011. She worked for a [employer] and was let go after a work-related injury when a roof fell on top of her on the job.  She lives with her mother. She has never been married.  OF SYSTEMS: No fevers or chills. Positive for weight loss. No double or blurred vision. No hearing loss. No shortness of breath or cough. No chest pain or orthopnea. No abdominal pain, nausea, vomiting, or diarrhea. No incontinence or frequency. No heat or cold intolerance. No anemia or easy bruising. No acne or rash. No muscle or joint pain. Positive for right sided weakness that is now resolving. See history of present illness for details.  EXAMINATION: SIGNS: Blood pressure 137/92, pulse 84, respirations 20, temperature 98.3. GENERAL: This is an obese female in no acute distress.  HEENT: The pupils are equal, round, and reactive to light. Sclerae anicteric. Supple. No thyromegaly. Clear to auscultation. No dullness to percussion. Regular rhythm and rate. No murmurs, rubs, or gallops. Soft, nontender, nondistended. Positive bowel sounds. Normal muscle strength in all extremities. No rashes or bruises. No cervical adenopathy. Cranial nerves II through XII are intact.   DATA: Chemistries are within normal limits. Blood alcohol level on admission was 0.109. LFTs within normal limits. TSH 4.8. UDS is negative for substances. CBC within normal limits. Urinalysis is not suggestive of urinary tract infection.   STATUS EXAMINATION ON ADMISSION: The patient is alert and oriented to person, place, time, and situation. She is pleasant, polite and cooperative. She is well groomed wearing a hospital gown. She maintains good eye contact. Her speech is slow. There is poverty of speech. Mood is  depressed with flat affect. Thought process is logical and goal oriented. Thought content: She reports frequent thought of wanting to die. There are no thoughts of hurting others. There are no delusions or paranoia. There  are no auditory or visual hallucinations. Her cognition is grossly intact. Her insight and judgment are poor.  SUICIDE RISK ASSESSMENT ON ADMISSION: This is a patient with a history of depression, mood instability, suicide attempt and alcoholism who relapsed on alcohol in the context of recent major loss. She is at increased risk of suicide.  DIAGNOSES:I:   Major depressive disorder, recurrent, severe.  Alcohol dependence.  AXIS II:  Deferred. III:  HTN, CVA, Status post work-related injury. IV:  Mental illness, major loss, substance abuse, primary support, employment, financial. V:  GAF on admission 25.  The patient was admitted to [place] unit for safety, stabilization and medication management. She was initially placed on suicide precautions and was closely monitored for any unsafe behaviors. She underwent full psychiatric and risk assessment. She received pharmacotherapy, individual and group psychotherapy, substance abuse counseling, and support from therapeutic milieu.    Suicidal ideation: The patient is able to contract for safety.    Alcohol detox: She was started on the CIWA protocol on medical floor. We will continue with a brief Ativan taper.     Mood: There is a history of treatment noncompliance. The patient does not believe that medications have been useful. We will offer Trazodone for sleep.    4.  Substance abuse treatment: The patient is not interested in residential treatment.  Disposition: She will be discharged to home.   Electronic Signatures: Orson Slick (MD)  (Signed on 12-Nov-14 05:47)  Authored  Last Updated: 12-Nov-14 05:47 by Orson Slick (MD)

## 2014-11-28 ENCOUNTER — Encounter (INDEPENDENT_AMBULATORY_CARE_PROVIDER_SITE_OTHER): Payer: Self-pay

## 2015-01-02 ENCOUNTER — Other Ambulatory Visit: Payer: Self-pay

## 2015-01-09 ENCOUNTER — Ambulatory Visit: Payer: Self-pay

## 2015-01-21 ENCOUNTER — Ambulatory Visit: Payer: Self-pay

## 2015-04-15 ENCOUNTER — Other Ambulatory Visit: Payer: Self-pay

## 2015-04-17 ENCOUNTER — Other Ambulatory Visit: Payer: Self-pay

## 2015-04-22 ENCOUNTER — Ambulatory Visit: Payer: Self-pay

## 2015-04-24 ENCOUNTER — Ambulatory Visit: Payer: Self-pay

## 2015-04-24 ENCOUNTER — Other Ambulatory Visit: Payer: Self-pay

## 2015-05-01 ENCOUNTER — Ambulatory Visit: Payer: Self-pay

## 2015-05-08 ENCOUNTER — Other Ambulatory Visit: Payer: Self-pay

## 2015-05-20 ENCOUNTER — Other Ambulatory Visit: Payer: Self-pay

## 2015-05-20 LAB — BASIC METABOLIC PANEL
BUN: 16 mg/dL (ref 4–21)
CREATININE: 0.7 mg/dL (ref 0.5–1.1)
GLUCOSE: 117 mg/dL
POTASSIUM: 3.6 mmol/L (ref 3.4–5.3)
SODIUM: 140 mmol/L (ref 137–147)

## 2015-05-20 LAB — HEPATIC FUNCTION PANEL
ALK PHOS: 77 U/L (ref 25–125)
ALT: 16 U/L (ref 7–35)
AST: 17 U/L (ref 13–35)
BILIRUBIN, TOTAL: 0.5 mg/dL

## 2015-05-20 LAB — HEMOGLOBIN A1C: Hemoglobin A1C: 6.1

## 2015-05-20 LAB — TSH: TSH: 1.45 u[IU]/mL (ref 0.41–5.90)

## 2015-10-13 ENCOUNTER — Other Ambulatory Visit: Payer: Self-pay | Admitting: Urology

## 2015-10-17 DIAGNOSIS — I10 Essential (primary) hypertension: Secondary | ICD-10-CM

## 2015-10-17 DIAGNOSIS — R079 Chest pain, unspecified: Secondary | ICD-10-CM

## 2015-10-17 DIAGNOSIS — M722 Plantar fascial fibromatosis: Secondary | ICD-10-CM

## 2015-10-17 DIAGNOSIS — G43909 Migraine, unspecified, not intractable, without status migrainosus: Secondary | ICD-10-CM

## 2016-04-01 ENCOUNTER — Encounter: Payer: Self-pay | Admitting: Pharmacist

## 2016-06-14 ENCOUNTER — Ambulatory Visit: Payer: Self-pay | Admitting: Pharmacist

## 2016-06-14 ENCOUNTER — Encounter: Payer: Self-pay | Admitting: Pharmacist

## 2016-06-14 ENCOUNTER — Encounter (INDEPENDENT_AMBULATORY_CARE_PROVIDER_SITE_OTHER): Payer: Self-pay

## 2016-06-14 VITALS — BP 122/80

## 2016-06-14 NOTE — Progress Notes (Signed)
  [facility] Visit Note  Patient: Meredith Juarez MRN: [id number] Date of Birth: 07-28-1960 PCP: No primary care provider on file.   [name] 55 y.o. female presents for an annual medication review visit today. She is not feeling well today; states she has a cold. We briefly reviewed her medications although she admits to not being able to pronounce the names. She did not bring her medications or a list.  BP 122/80 (BP Location: Left Arm, Patient Position: Sitting)   Patient Information   Past Medical History:  Diagnosis Date  . Anemia   . Depression   . Diverticulitis   . GERD (gastroesophageal reflux disease)   . Hypertension   . Migraines   . Stroke ([place])   . Thyroid disease      History reviewed. No pertinent surgical history.   Family History  Problem Relation Age of Onset  . Cancer Mother     Uterine, Breast, Ovarian  . Hypertension Mother   . Hypertension Father   . Bipolar disorder Father   . CAD Sister   . Hyperlipidemia Sister     New Diagnoses (since last visit):   Family Support: Comments:Information not available  Lifestyle Diet: Breakfast: Lunch: Dinner: Drinks:            History  Alcohol Use No      History  Smoking Status  . Current Every Day Smoker  . Packs/day: 0.25  . Types: Cigarettes  Smokeless Tobacco  . Never Used      Health Maintenance  Topic Date Due  . Hepatitis C Screening  09-May-1961  . HIV Screening  03/02/1976  . TETANUS/TDAP  03/02/1980  . PAP SMEAR  03/02/1982  . MAMMOGRAM  03/03/2011  . COLONOSCOPY  03/03/2011  . INFLUENZA VACCINE  01/27/2016     Assessment and Plan: Ms. Gasparyan appeared unsure of what medications she is taking.  She did not have a list of her medications or the bottles. [facility] has not filled her amlodipine, potassium, clopidogrel, hydrochlorothiazide or metoprolol since 10/14/15 (90 day supply).   Today (after the visit) she presented new  prescriptions to the pharmacy for quetiapine 100 mg at bedtime (previously on 50 mg ER daily), trazodone 100 mg- 1/2 to 1 tablet at night as needed and sertraline 100 mg daily (previously on 50 mg). She will be following up with Dr. Jamse Arn in January.  She complains of being "sleepy" and "dizzy" all of the time. States she fell a few weeks ago. She is not sure if this is related to her medications.  Patient has asked that I contact [facility] to reestablish primary care.  I will update the AVS and reprint. She will be picking up the prescriptions tomorrow or later this week.    Josephanthony Tindel K. Dicky Doe, PharmD [facility] [place] Operations Coordinator [phone]

## 2016-06-15 ENCOUNTER — Emergency Department
Admission: EM | Admit: 2016-06-15 | Discharge: 2016-06-15 | Disposition: A | Discharge status: Home / Self Care | Payer: Self-pay | Attending: Emergency Medicine | Admitting: Emergency Medicine

## 2016-06-15 DIAGNOSIS — F1410 Cocaine abuse, uncomplicated: Secondary | ICD-10-CM

## 2016-06-15 DIAGNOSIS — F331 Major depressive disorder, recurrent, moderate: Secondary | ICD-10-CM

## 2016-06-15 DIAGNOSIS — F17210 Nicotine dependence, cigarettes, uncomplicated: Secondary | ICD-10-CM | POA: Insufficient documentation

## 2016-06-15 DIAGNOSIS — Z79899 Other long term (current) drug therapy: Secondary | ICD-10-CM | POA: Insufficient documentation

## 2016-06-15 DIAGNOSIS — F10129 Alcohol abuse with intoxication, unspecified: Secondary | ICD-10-CM | POA: Insufficient documentation

## 2016-06-15 DIAGNOSIS — F10920 Alcohol use, unspecified with intoxication, uncomplicated: Secondary | ICD-10-CM

## 2016-06-15 DIAGNOSIS — F4325 Adjustment disorder with mixed disturbance of emotions and conduct: Secondary | ICD-10-CM

## 2016-06-15 DIAGNOSIS — E876 Hypokalemia: Secondary | ICD-10-CM

## 2016-06-15 DIAGNOSIS — R45851 Suicidal ideations: Secondary | ICD-10-CM

## 2016-06-15 DIAGNOSIS — I10 Essential (primary) hypertension: Secondary | ICD-10-CM | POA: Insufficient documentation

## 2016-06-15 LAB — URINALYSIS, COMPLETE (UACMP) WITH MICROSCOPIC
BILIRUBIN URINE: NEGATIVE
Bacteria, UA: NONE SEEN
GLUCOSE, UA: NEGATIVE mg/dL
Hgb urine dipstick: NEGATIVE
KETONES UR: NEGATIVE mg/dL
LEUKOCYTES UA: NEGATIVE
NITRITE: NEGATIVE
PH: 6 (ref 5.0–8.0)
Protein, ur: NEGATIVE mg/dL
RBC / HPF: NONE SEEN RBC/hpf (ref 0–5)
SPECIFIC GRAVITY, URINE: 1.001 — AB (ref 1.005–1.030)
Squamous Epithelial / LPF: NONE SEEN
WBC, UA: NONE SEEN WBC/hpf (ref 0–5)

## 2016-06-15 LAB — URINE DRUG SCREEN, QUALITATIVE (ARMC ONLY)
Amphetamines, Ur Screen: NOT DETECTED
BARBITURATES, UR SCREEN: NOT DETECTED
BENZODIAZEPINE, UR SCRN: NOT DETECTED
Cannabinoid 50 Ng, Ur ~~LOC~~: NOT DETECTED
Cocaine Metabolite,Ur ~~LOC~~: POSITIVE — AB
MDMA (Ecstasy)Ur Screen: NOT DETECTED
METHADONE SCREEN, URINE: NOT DETECTED
Opiate, Ur Screen: NOT DETECTED
Phencyclidine (PCP) Ur S: NOT DETECTED
TRICYCLIC, UR SCREEN: NOT DETECTED

## 2016-06-15 LAB — COMPREHENSIVE METABOLIC PANEL
ALT: 18 U/L (ref 14–54)
ANION GAP: 14 (ref 5–15)
AST: 24 U/L (ref 15–41)
Albumin: 4.1 g/dL (ref 3.5–5.0)
Alkaline Phosphatase: 73 U/L (ref 38–126)
BUN: 6 mg/dL (ref 6–20)
CHLORIDE: 101 mmol/L (ref 101–111)
CO2: 25 mmol/L (ref 22–32)
Calcium: 9.4 mg/dL (ref 8.9–10.3)
Creatinine, Ser: 0.59 mg/dL (ref 0.44–1.00)
Glucose, Bld: 81 mg/dL (ref 65–99)
POTASSIUM: 2.5 mmol/L — AB (ref 3.5–5.1)
Sodium: 140 mmol/L (ref 135–145)
Total Bilirubin: 0.7 mg/dL (ref 0.3–1.2)
Total Protein: 7.4 g/dL (ref 6.5–8.1)

## 2016-06-15 LAB — CBC WITH DIFFERENTIAL/PLATELET
BASOS ABS: 0.1 10*3/uL (ref 0–0.1)
Basophils Relative: 1 %
EOS PCT: 1 %
Eosinophils Absolute: 0.1 10*3/uL (ref 0–0.7)
HCT: 45.9 % (ref 35.0–47.0)
Hemoglobin: 16 g/dL (ref 12.0–16.0)
LYMPHS PCT: 27 %
Lymphs Abs: 2.8 10*3/uL (ref 1.0–3.6)
MCH: 29.2 pg (ref 26.0–34.0)
MCHC: 34.8 g/dL (ref 32.0–36.0)
MCV: 83.8 fL (ref 80.0–100.0)
MONO ABS: 0.5 10*3/uL (ref 0.2–0.9)
Monocytes Relative: 5 %
Neutro Abs: 6.8 10*3/uL — ABNORMAL HIGH (ref 1.4–6.5)
Neutrophils Relative %: 66 %
PLATELETS: 282 10*3/uL (ref 150–440)
RBC: 5.48 MIL/uL — ABNORMAL HIGH (ref 3.80–5.20)
RDW: 15.2 % — ABNORMAL HIGH (ref 11.5–14.5)
WBC: 10.3 10*3/uL (ref 3.6–11.0)

## 2016-06-15 LAB — ETHANOL: ALCOHOL ETHYL (B): 227 mg/dL — AB (ref <5)

## 2016-06-15 LAB — ACETAMINOPHEN LEVEL

## 2016-06-15 LAB — SALICYLATE LEVEL

## 2016-06-15 LAB — POTASSIUM: Potassium: 3.4 mmol/L — ABNORMAL LOW (ref 3.5–5.1)

## 2016-06-15 MED ORDER — POTASSIUM CHLORIDE ER 10 MEQ PO TBCR: 10.0000 meq | EXTENDED_RELEASE_TABLET | Freq: Every day | ORAL | 0 refills | Status: DC

## 2016-06-15 MED ORDER — LORAZEPAM 2 MG PO TABS: 0.0000 mg | ORAL_TABLET | Freq: Four times a day (QID) | ORAL | Status: DC

## 2016-06-15 MED ORDER — VITAMIN B-1 100 MG PO TABS
100.0000 mg | ORAL_TABLET | Freq: Every day | ORAL | Status: DC
  Administered 2016-06-15: 100 mg via ORAL
  Filled 2016-06-15: qty 1

## 2016-06-15 MED ORDER — POTASSIUM CHLORIDE CRYS ER 20 MEQ PO TBCR
60.0000 meq | EXTENDED_RELEASE_TABLET | Freq: Once | ORAL | Status: AC
  Administered 2016-06-15: 60 meq via ORAL
  Filled 2016-06-15: qty 3

## 2016-06-15 NOTE — ED Provider Notes (Signed)
[facility] Emergency Department Provider Note   ____________________________________________   First MD Initiated Contact with Patient 06/15/16 0024     (approximate)  I have reviewed the triage vital signs and the nursing notes.   HISTORY  Chief Complaint Suicidal    HPI Meredith Juarez is a 55 y.o. female brought to the ED from home via EMS with a chief complaint of depression with suicidal ideation. Patient found her friend dead yesterday, presumably from a cardiac arrest. Reports she has had several tests and her family recently. Admits to suicidal ideation without plan. Has a history of depression. Extremely tearful and distraught. Voices no medical complaints. Specifically, denies recent fever, chills, chest pain, shortness of breath, abdominal pain, nausea, vomiting, diarrhea. Denies recent travel trauma. Nothing makes her symptoms better or worse.   Past Medical History:  Diagnosis Date  . Anemia   . Depression   . Diverticulitis   . GERD (gastroesophageal reflux disease)   . Hypertension   . Migraines   . Stroke ([place])   . Thyroid disease     Patient Active Problem List   Diagnosis Date Noted  . Migraines 09/05/2014  . Plantar fasciitis 09/05/2014  . Chest pain 01/01/2014  . Hypertension 01/01/2014    History reviewed. No pertinent surgical history.  Prior to Admission medications   Medication Sig Start Date End Date Taking? Authorizing Provider  acetaminophen (TYLENOL) 500 MG tablet Take 500 mg by mouth every 4 (four) hours as needed.    Historical Provider, MD  amLODipine (NORVASC) 5 MG tablet TAKE ONE TABLET BY MOUTH EVERY DAY 10/13/15   Nori Riis, PA-C  aspirin EC 81 MG tablet Take 81 mg by mouth daily.    Historical Provider, MD  clopidogrel (PLAVIX) 75 MG tablet TAKE ONE TABLET BY MOUTH EVERY DAY. 10/13/15   Nori Riis, PA-C  hydrochlorothiazide (HYDRODIURIL) 25 MG tablet TAKE ONE TABLET BY MOUTH EVERY DAY.  10/13/15   Nori Riis, PA-C  metoprolol tartrate (LOPRESSOR) 25 MG tablet TAKE ONE TABLET BY MOUTH EVERY DAY. 10/13/15   Shannon A McGowan, PA-C  NITROSTAT 0.4 MG SL tablet DISSOLVE 1 TABLET UNDER TONGUE EVERY 5 MINUTES FOR CHEST PAIN. MAX 3 DOSES. IF NO RELIEF AFTER 1st DOSE, CALL 911. 10/13/15   Nori Riis, PA-C  potassium chloride SA (K-DUR,KLOR-CON) 20 MEQ tablet TAKE 1 TABLET BY MOUTH EVERY DAY 10/13/15   Nori Riis, PA-C  QUEtiapine (SEROQUEL) 100 MG tablet Take 100 mg by mouth at bedtime.    Floria Raveling, MD  ranitidine (ZANTAC) 150 MG tablet TAKE ONE TABLET BY MOUTH 2 TIMES A DAY. 10/13/15   Nori Riis, PA-C  sertraline (ZOLOFT) 100 MG tablet Take 100 mg by mouth daily.    Floria Raveling, MD  traZODone (DESYREL) 100 MG tablet Take 100 mg by mouth at bedtime. 1/2 - 1 tablet as needed    Floria Raveling, MD    Allergies Patient has no known allergies.  Family History  Problem Relation Age of Onset  . Cancer Mother     Uterine, Breast, Ovarian  . Hypertension Mother   . Hypertension Father   . Bipolar disorder Father   . CAD Sister   . Hyperlipidemia Sister     Social History Social History  Substance Use Topics  . Smoking status: Current Every Day Smoker    Packs/day: 0.25    Types: Cigarettes  . Smokeless tobacco: Never Used  . Alcohol use No  Review of Systems  Constitutional: No fever/chills. Eyes: No visual changes. ENT: No sore throat. Cardiovascular: Denies chest pain. Respiratory: Denies shortness of breath. Gastrointestinal: No abdominal pain.  No nausea, no vomiting.  No diarrhea.  No constipation. Genitourinary: Negative for dysuria. Musculoskeletal: Negative for back pain. Skin: Negative for rash. Neurological: Negative for headaches, focal weakness or numbness. Psychiatric:Positive for depression with SI.  10-point ROS otherwise negative.  ____________________________________________   PHYSICAL EXAM:  VITAL SIGNS: ED Triage  Vitals  Enc Vitals Group     BP      Pulse      Resp      Temp      Temp src      SpO2      Weight      Height      Head Circumference      Peak Flow      Pain Score      Pain Loc      Pain Edu?      Excl. in [place]?     Constitutional: Alert and oriented. Disheveled appearing and in moderate acute distress. Eyes: Conjunctivae are normal. PERRL. EOMI. Head: Atraumatic. Nose: No congestion/rhinnorhea. Mouth/Throat: Mucous membranes are moist.  Oropharynx non-erythematous. Neck: No stridor.   Cardiovascular: Normal rate, regular rhythm. Grossly normal heart sounds.  Good peripheral circulation. Respiratory: Normal respiratory effort.  No retractions. Lungs CTAB. Gastrointestinal: Soft and nontender. No distention. No abdominal bruits. No CVA tenderness. Musculoskeletal: No lower extremity tenderness nor edema.  No joint effusions. Neurologic:  Normal speech and language. No gross focal neurologic deficits are appreciated. No gait instability. Skin:  Skin is warm, dry and intact. No rash noted. Psychiatric: Mood and affect are tearful, histrionic and upset. Speech and behavior are normal.  ____________________________________________   LABS (all labs ordered are listed, but only abnormal results are displayed)  Labs Reviewed  CBC WITH DIFFERENTIAL/PLATELET - Abnormal; Notable for the following:       Result Value   RBC 5.48 (*)    RDW 15.2 (*)    Neutro Abs 6.8 (*)    All other components within normal limits  COMPREHENSIVE METABOLIC PANEL - Abnormal; Notable for the following:    Potassium 2.5 (*)    All other components within normal limits  ACETAMINOPHEN LEVEL - Abnormal; Notable for the following:    Acetaminophen (Tylenol), Serum <10 (*)    All other components within normal limits  ETHANOL - Abnormal; Notable for the following:    Alcohol, Ethyl (B) 227 (*)    All other components within normal limits  URINE DRUG SCREEN, QUALITATIVE ([facility] ONLY) - Abnormal; Notable  for the following:    Cocaine Metabolite,Ur [place] POSITIVE (*)    All other components within normal limits  URINALYSIS, COMPLETE (UACMP) WITH MICROSCOPIC - Abnormal; Notable for the following:    Color, Urine COLORLESS (*)    APPearance CLEAR (*)    Specific Gravity, Urine 1.001 (*)    All other components within normal limits  SALICYLATE LEVEL   ____________________________________________  EKG  None ____________________________________________  RADIOLOGY  None ____________________________________________   PROCEDURES  Procedure(s) performed: None  Procedures  Critical Care performed: No  ____________________________________________   INITIAL IMPRESSION / ASSESSMENT AND PLAN / ED COURSE  Pertinent labs & imaging results that were available during my care of the patient were reviewed by me and considered in my medical decision making (see chart for details).  55 year old female who presents with depression and suicidal ideation after  finding her friend died yesterday. Even though she does not have an active plan for suicide, she is extremely emotional and distraught and repeatedly voicing suicidal thoughts. Will place patient under involuntary commitment for her safety. Patient declines offer for Ativan at this time. Will consult TTS and psychiatry to evaluate patient in the emergency department.  Clinical Course as of Jun 15 614  Tue Jun 15, 2016  0614 No events overnight. Patient will remain in the emergency department under involuntary commitment pending psychiatry disposition.  [JS]    Clinical Course User Index [JS] Paulette Blanch, MD     ____________________________________________   FINAL CLINICAL IMPRESSION(S) / ED DIAGNOSES  Final diagnoses:  Suicidal thoughts  Moderate episode of recurrent major depressive disorder ([place])  Cocaine abuse  Alcoholic intoxication without complication ([place])  Hypokalemia      NEW MEDICATIONS STARTED DURING THIS  VISIT:  New Prescriptions   No medications on file     Note:  This document was prepared using Dragon voice recognition software and may include unintentional dictation errors.    Paulette Blanch, MD 06/15/16 [phone]

## 2016-06-15 NOTE — ED Notes (Signed)
BEHAVIORAL HEALTH ROUNDING Patient sleeping: Yes Patient alert and oriented: not applicable Behavior appropriate: Yes.  ; If no, describe:  Nutrition and fluids offered: Yes  Toileting and hygiene offered: No Sitter present: not applicable Law enforcement present: Yes  

## 2016-06-15 NOTE — ED Notes (Signed)
BEHAVIORAL HEALTH ROUNDING Patient sleeping: No. Patient alert and oriented: yes Behavior appropriate: Yes.  ; If no, describe:  Nutrition and fluids offered: Yes  Toileting and hygiene offered: Yes  Sitter present: not applicable Law enforcement present: Yes  

## 2016-06-15 NOTE — Consult Note (Signed)
[facility] Face-to-Face Psychiatry Consult   Reason for Consult:  Consult for 55 year old woman who came to the emergency room last night intoxicated and reporting suicidal thoughts. Referring Physician:  Cinda Quest Patient Identification: Meredith Juarez MRN:  [id number] Principal Diagnosis: Adjustment disorder with mixed disturbance of emotions and conduct Diagnosis:   Patient Active Problem List   Diagnosis Date Noted  . Suicidal ideation [R45.851] 06/15/2016  . Alcohol abuse [F10.10] 06/15/2016  . Adjustment disorder with mixed disturbance of emotions and conduct [F43.25] 06/15/2016  . Hypokalemia [E87.6] 06/15/2016  . Migraines [G43.909] 09/05/2014  . Plantar fasciitis [M72.2] 09/05/2014  . Chest pain [R07.9] 01/01/2014  . Hypertension [I10] 01/01/2014    Total Time spent with patient: 1 hour  Subjective:   Meredith Juarez is a 55 y.o. female patient admitted with "I found a friend of mine dead".  HPI:  Patient interviewed. Chart reviewed. Patient tells me that yesterday afternoon she found a friend of hers dead. She had been visiting him and found him in his bathroom apparently having had a heart attack. Obviously she was upset about this. Admits that she went drinking last night. Tells me that she only had "a little" to drink but her blood alcohol level was 227 when she presented here. She was endorsing thoughts of suicide last night but had not acted on it. Said her mood was feeling upset anxious and depressed. She claims that prior to the events of yesterday however her mood had been fine. She had not been feeling depressed or having suicidal thoughts and had been compliant with her medication. Said she had been staying off alcohol for a while. Denies having any hallucinations. This morning she is requesting to go home but she still looks pretty fatigued and a little.  Medical history: Patient also presented with hypokalemia when she came in. She received oral potassium replacement and that  has improved. Has a history of some chronic pain high blood pressure and migraines.  Social history: Says she is living with a friend. Not working regularly. Goes to [facility] for outpatient treatment.  Substance abuse history: Established history of alcohol abuse. No history of DTs or seizures. Has abused other drugs. She denied to me but her drug screen is positive for cocaine.  Past Psychiatric History: Patient does have a past history of suicide attempts. She has had a past history of psychiatric hospitalization. Currently says she is a client at [facility] and takes Seroquel Zoloft and trazodone.  Risk to Self: Suicidal Ideation: No-Not Currently/Within Last 6 Months Suicidal Intent: No-Not Currently/Within Last 6 Months Is patient at risk for suicide?: No Suicidal Plan?: No Access to Means: No What has been your use of drugs/alcohol within the last 12 months?: Reports use of alcohol, evidence of cocaine use How many times?: 2 Other Self Harm Risks: denied Triggers for Past Attempts: Unknown Intentional Self Injurious Behavior: None Risk to Others: Homicidal Ideation: No Thoughts of Harm to Others: No Current Homicidal Intent: No Current Homicidal Plan: No Access to Homicidal Means: No Identified Victim: None identified History of harm to others?: No Assessment of Violence: None Noted Violent Behavior Description: n/a Does patient have access to weapons?: No Criminal Charges Pending?: No Does patient have a court date: No Prior Inpatient Therapy: Prior Inpatient Therapy: No (Patient refused to answer) Prior Therapy Dates:  (Unknown) Prior Therapy Facilty/Provider(s):  (Unknown) Reason for Treatment: Unknown Prior Outpatient Therapy: Prior Outpatient Therapy: Yes Prior Therapy Dates: Current Prior Therapy Facilty/Provider(s): [facility]-Dr. Jamse Arn Reason for Treatment: Depression  Does patient have an ACCT team?: No Does patient have Intensive In-House Services?  : No Does patient have [facility]  services? : No Does patient have P4CC services?: No  Past Medical History:  Past Medical History:  Diagnosis Date  . Anemia   . Depression   . Diverticulitis   . GERD (gastroesophageal reflux disease)   . Hypertension   . Migraines   . Stroke ([place])   . Thyroid disease    History reviewed. No pertinent surgical history. Family History:  Family History  Problem Relation Age of Onset  . Cancer Mother     Uterine, Breast, Ovarian  . Hypertension Mother   . Hypertension Father   . Bipolar disorder Father   . CAD Sister   . Hyperlipidemia Sister    Family Psychiatric  History: Family history positive for depression and substance abuse Social History:  History  Alcohol Use No     History  Drug Use No    Social History   Social History  . Marital status: Single    Spouse name: N/A  . Number of children: N/A  . Years of education: N/A   Social History Main Topics  . Smoking status: Current Every Day Smoker    Packs/day: 0.25    Types: Cigarettes  . Smokeless tobacco: Never Used  . Alcohol use No  . Drug use: No  . Sexual activity: Not Asked   Other Topics Concern  . None   Social History Narrative  . None   Additional Social History:    Allergies:  No Known Allergies  Labs:  Results for orders placed or performed during the hospital encounter of 06/15/16 (from the past 48 hour(s))  CBC with Differential     Status: Abnormal   Collection Time: 06/15/16 12:44 AM  Result Value Ref Range   WBC 10.3 3.6 - 11.0 K/uL   RBC 5.48 (H) 3.80 - 5.20 MIL/uL   Hemoglobin 16.0 12.0 - 16.0 g/dL   HCT 45.9 35.0 - 47.0 %   MCV 83.8 80.0 - 100.0 fL   MCH 29.2 26.0 - 34.0 pg   MCHC 34.8 32.0 - 36.0 g/dL   RDW 15.2 (H) 11.5 - 14.5 %   Platelets 282 150 - 440 K/uL   Neutrophils Relative % 66 %   Neutro Abs 6.8 (H) 1.4 - 6.5 K/uL   Lymphocytes Relative 27 %   Lymphs Abs 2.8 1.0 - 3.6 K/uL   Monocytes Relative 5 %   Monocytes Absolute 0.5 0.2 - 0.9 K/uL   Eosinophils  Relative 1 %   Eosinophils Absolute 0.1 0 - 0.7 K/uL   Basophils Relative 1 %   Basophils Absolute 0.1 0 - 0.1 K/uL  Comprehensive metabolic panel     Status: Abnormal   Collection Time: 06/15/16 12:44 AM  Result Value Ref Range   Sodium 140 135 - 145 mmol/L   Potassium 2.5 (LL) 3.5 - 5.1 mmol/L    Comment: CRITICAL RESULT CALLED TO, READ BACK BY AND VERIFIED WITH CALLED IRIS GUIDRY AT 8250 ON 06/15/16 BY SNJ    Chloride 101 101 - 111 mmol/L   CO2 25 22 - 32 mmol/L   Glucose, Bld 81 65 - 99 mg/dL   BUN 6 6 - 20 mg/dL   Creatinine, Ser 0.59 0.44 - 1.00 mg/dL   Calcium 9.4 8.9 - 10.3 mg/dL   Total Protein 7.4 6.5 - 8.1 g/dL   Albumin 4.1 3.5 - 5.0 g/dL   AST 24  15 - 41 U/L   ALT 18 14 - 54 U/L   Alkaline Phosphatase 73 38 - 126 U/L   Total Bilirubin 0.7 0.3 - 1.2 mg/dL   GFR calc non Af Amer >60 >60 mL/min   GFR calc Af Amer >60 >60 mL/min    Comment: (NOTE) The eGFR has been calculated using the CKD EPI equation. This calculation has not been validated in all clinical situations. eGFR's persistently <60 mL/min signify possible Chronic Kidney Disease.    Anion gap 14 5 - 15  Acetaminophen level     Status: Abnormal   Collection Time: 06/15/16 12:44 AM  Result Value Ref Range   Acetaminophen (Tylenol), Serum <10 (L) 10 - 30 ug/mL    Comment:        THERAPEUTIC CONCENTRATIONS VARY SIGNIFICANTLY. A RANGE OF 10-30 ug/mL MAY BE AN EFFECTIVE CONCENTRATION FOR MANY PATIENTS. HOWEVER, SOME ARE BEST TREATED AT CONCENTRATIONS OUTSIDE THIS RANGE. ACETAMINOPHEN CONCENTRATIONS >150 ug/mL AT 4 HOURS AFTER INGESTION AND >50 ug/mL AT 12 HOURS AFTER INGESTION ARE OFTEN ASSOCIATED WITH TOXIC REACTIONS.   Salicylate level     Status: None   Collection Time: 06/15/16 12:44 AM  Result Value Ref Range   Salicylate Lvl <4.6 2.8 - 30.0 mg/dL  Ethanol     Status: Abnormal   Collection Time: 06/15/16 12:44 AM  Result Value Ref Range   Alcohol, Ethyl (B) 227 (H) <5 mg/dL    Comment:         LOWEST DETECTABLE LIMIT FOR SERUM ALCOHOL IS 5 mg/dL FOR MEDICAL PURPOSES ONLY   Urine Drug Screen, Qualitative ([facility] only)     Status: Abnormal   Collection Time: 06/15/16 12:45 AM  Result Value Ref Range   Tricyclic, Ur Screen NONE DETECTED NONE DETECTED   Amphetamines, Ur Screen NONE DETECTED NONE DETECTED   MDMA (Ecstasy)Ur Screen NONE DETECTED NONE DETECTED   Cocaine Metabolite,Ur [place] POSITIVE (A) NONE DETECTED   Opiate, Ur Screen NONE DETECTED NONE DETECTED   Phencyclidine (PCP) Ur S NONE DETECTED NONE DETECTED   Cannabinoid 50 Ng, Ur [place] NONE DETECTED NONE DETECTED   Barbiturates, Ur Screen NONE DETECTED NONE DETECTED   Benzodiazepine, Ur Scrn NONE DETECTED NONE DETECTED   Methadone Scn, Ur NONE DETECTED NONE DETECTED    Comment: (NOTE) 270  Tricyclics, urine               Cutoff 1000 ng/mL 200  Amphetamines, urine             Cutoff 1000 ng/mL 300  MDMA (Ecstasy), urine           Cutoff 500 ng/mL 400  Cocaine Metabolite, urine       Cutoff 300 ng/mL 500  Opiate, urine                   Cutoff 300 ng/mL 600  Phencyclidine (PCP), urine      Cutoff 25 ng/mL 700  Cannabinoid, urine              Cutoff 50 ng/mL 800  Barbiturates, urine             Cutoff 200 ng/mL 900  Benzodiazepine, urine           Cutoff 200 ng/mL 1000 Methadone, urine                Cutoff 300 ng/mL 1100 1200 The urine drug screen provides only a preliminary, unconfirmed 1300 analytical test result and should not be  used for non-medical 1400 purposes. Clinical consideration and professional judgment should 1500 be applied to any positive drug screen result due to possible 1600 interfering substances. A more specific alternate chemical method 1700 must be used in order to obtain a confirmed analytical result.  1800 Gas chromato graphy / mass spectrometry (GC/MS) is the preferred 1900 confirmatory method.   Urinalysis, Complete w Microscopic     Status: Abnormal   Collection Time: 06/15/16 12:45 AM   Result Value Ref Range   Color, Urine COLORLESS (A) YELLOW   APPearance CLEAR (A) CLEAR   Specific Gravity, Urine 1.001 (L) 1.005 - 1.030   pH 6.0 5.0 - 8.0   Glucose, UA NEGATIVE NEGATIVE mg/dL   Hgb urine dipstick NEGATIVE NEGATIVE   Bilirubin Urine NEGATIVE NEGATIVE   Ketones, ur NEGATIVE NEGATIVE mg/dL   Protein, ur NEGATIVE NEGATIVE mg/dL   Nitrite NEGATIVE NEGATIVE   Leukocytes, UA NEGATIVE NEGATIVE   RBC / HPF NONE SEEN 0 - 5 RBC/hpf   WBC, UA NONE SEEN 0 - 5 WBC/hpf   Bacteria, UA NONE SEEN NONE SEEN   Squamous Epithelial / LPF NONE SEEN NONE SEEN  Potassium     Status: Abnormal   Collection Time: 06/15/16 12:03 PM  Result Value Ref Range   Potassium 3.4 (L) 3.5 - 5.1 mmol/L    Current Facility-Administered Medications  Medication Dose Route Frequency Provider Last Rate Last Dose  . LORazepam (ATIVAN) tablet 0-4 mg  0-4 mg Oral Q6H Paulette Blanch, MD      . thiamine (VITAMIN B-1) tablet 100 mg  100 mg Oral Daily Paulette Blanch, MD   100 mg at 06/15/16 7408   Current Outpatient Prescriptions  Medication Sig Dispense Refill  . acetaminophen (TYLENOL) 500 MG tablet Take 500 mg by mouth every 4 (four) hours as needed.    Marland Kitchen amLODipine (NORVASC) 5 MG tablet TAKE ONE TABLET BY MOUTH EVERY DAY 90 tablet 0  . aspirin EC 81 MG tablet Take 81 mg by mouth daily.    . clopidogrel (PLAVIX) 75 MG tablet TAKE ONE TABLET BY MOUTH EVERY DAY. 90 tablet 0  . hydrochlorothiazide (HYDRODIURIL) 25 MG tablet TAKE ONE TABLET BY MOUTH EVERY DAY. 90 tablet 0  . metoprolol tartrate (LOPRESSOR) 25 MG tablet TAKE ONE TABLET BY MOUTH EVERY DAY. 90 tablet 0  . NITROSTAT 0.4 MG SL tablet DISSOLVE 1 TABLET UNDER TONGUE EVERY 5 MINUTES FOR CHEST PAIN. MAX 3 DOSES. IF NO RELIEF AFTER 1st DOSE, CALL 911. 25 tablet 0  . potassium chloride SA (K-DUR,KLOR-CON) 20 MEQ tablet TAKE 1 TABLET BY MOUTH EVERY DAY 90 tablet 0  . QUEtiapine (SEROQUEL) 100 MG tablet Take 100 mg by mouth at bedtime.    . ranitidine (ZANTAC)  150 MG tablet TAKE ONE TABLET BY MOUTH 2 TIMES A DAY. 180 tablet 0  . sertraline (ZOLOFT) 100 MG tablet Take 100 mg by mouth daily.    . traZODone (DESYREL) 100 MG tablet Take 100 mg by mouth at bedtime. 1/2 - 1 tablet as needed      Musculoskeletal: Strength & Muscle Tone: within normal limits Gait & Station: unable to stand Patient leans: Backward  Psychiatric Specialty Exam: Physical Exam  Nursing note and vitals reviewed. Constitutional: She appears well-developed and well-nourished.  HENT:  Head: Normocephalic and atraumatic.  Eyes: Conjunctivae are normal. Pupils are equal, round, and reactive to light.  Neck: Normal range of motion.  Cardiovascular: Regular rhythm and normal heart sounds.   Respiratory:  Effort normal. No respiratory distress.  GI: Soft.  Musculoskeletal: Normal range of motion.  Neurological: She is alert.  Skin: Skin is warm and dry.  Psychiatric: Her affect is blunt. Her speech is delayed and tangential. She is slowed and withdrawn. Cognition and memory are impaired. She expresses impulsivity. She expresses suicidal ideation. She expresses no suicidal plans.    Review of Systems  Constitutional: Positive for malaise/fatigue.  HENT: Negative.   Eyes: Negative.   Respiratory: Negative.   Cardiovascular: Negative.   Gastrointestinal: Positive for nausea.  Musculoskeletal: Negative.   Skin: Negative.   Neurological: Positive for weakness.  Psychiatric/Behavioral: Positive for depression, substance abuse and suicidal ideas. Negative for hallucinations and memory loss. The patient is not nervous/anxious and does not have insomnia.     Blood pressure 125/67, pulse 94, temperature 98.6 F (37 C), temperature source Oral, resp. rate 18, height '5\' 5"'  (1.651 m), weight 86.2 kg (190 lb), SpO2 96 %.Body mass index is 31.62 kg/m.  General Appearance: Disheveled  Eye Contact:  Minimal  Speech:  Slow and Slurred  Volume:  Decreased  Mood:  Depressed  Affect:   Congruent  Thought Process:  Goal Directed  Orientation:  Full (Time, Place, and Person)  Thought Content:  Logical  Suicidal Thoughts:  Yes.  without intent/plan  Homicidal Thoughts:  No  Memory:  Immediate;   Fair Recent;   Fair Remote;   Fair  Judgement:  Impaired  Insight:  Shallow  Psychomotor Activity:  Decreased  Concentration:  Concentration: Fair  Recall:  [employer] of Knowledge:  Fair  Language:  Fair  Akathisia:  No  Handed:  Right  AIMS (if indicated):     Assets:  Desire for Improvement Housing Physical Health Resilience  ADL's:  Impaired  Cognition:  WNL  Sleep:        Treatment Plan Summary: Plan 55 year old woman presented intoxicated last night. Last night she was clearly stating suicidal ideation. This morning she tells me she is not suicidal but really can't contract for appropriate safety. She says that she can't promise me that she wouldn't feel suicidal if she went home. She is requesting discharge but she still looks halfway intoxicated this morning. I am going to leave the IVC in place and reevaluate her in a couple hours. I know she would like to go home and it sounds like the intoxication was the acute problem but she is really not safe enough to walk out this moment. I did order recheck of her potassium which seems to of helped.  Disposition: Supportive therapy provided about ongoing stressors.  Alethia Berthold, MD 06/15/2016 12:45 PM

## 2016-06-15 NOTE — Progress Notes (Signed)
Ms. Villwock refused to speak with the TTS.  TTS was informed of the patient's elevated BAL (227).   TTS will attempt to assess once her BAL reduces.

## 2016-06-15 NOTE — ED Notes (Signed)
Patient in bathroom at this time.

## 2016-06-15 NOTE — ED Notes (Addendum)
WRONG PT 

## 2016-06-15 NOTE — ED Notes (Signed)
BEHAVIORAL HEALTH ROUNDING Patient sleeping: Yes.   Patient alert and oriented: not applicable Behavior appropriate: Yes.  ; If no, describe:  Nutrition and fluids offered: No Toileting and hygiene offered: No Sitter present: not applicable Law enforcement present: Yes  

## 2016-06-15 NOTE — Discharge Instructions (Signed)
You have been seen in the Emergency Department (ED)  today for psychiatric issues.  You have been evaluated by the behavioral medicine specialists and are being referred to: ° °[facility] °[address] °[place], [place] [address] °Phone:  [phone] or [phone] ° °Open Access:   °Walk-in ASSESSMENT hours, M-W-F, 8:00am - 3:00pm °Advanced Acess CRISIS:  M-F, 8:00am - 8:00pm °Outpatient Services Office Hours:  M-F, 8:00am - 5:00pm ° °Please return to the Emergency Department (ED)  immediately if you have ANY thoughts of hurting yourself or anyone else, so that we may help you. ° °Please avoid alcohol and drug use. ° °Follow up with your doctor and/or therapist as soon as possible regarding today's ED  visit.   Please follow up any other recommendations and clinic appointments provided by the psychiatry team that saw you in the Emergency Department. ° °

## 2016-06-15 NOTE — ED Notes (Signed)
EDP notified of critical potassium level.

## 2016-06-15 NOTE — ED Triage Notes (Signed)
Pt found her friend dead today.  Pt states she has had several deaths in her family and is depressed.  Pt is positive for SI, but denies HI.  Pt states she has been taking medication for depression and has been treated for SI before.

## 2016-06-15 NOTE — ED Provider Notes (Signed)
Patient released from commitment with recommendation for ongoing outpatient care per Dr. Weber Cooks of psychiatry.   Vitals:   06/15/16 0553 06/15/16 1403  BP: 125/67 131/78  Pulse: 94 71  Resp: 18 16  Temp:       Patient awake and alert. No distress.   Meredith Kitten, MD 06/15/16 [phone]

## 2016-06-15 NOTE — Consult Note (Signed)
  Psychiatry: Follow-up note after evaluation earlier today. Patient is now more awake and alert. She is alert and oriented. Affect calm. Patient denies any current thoughts about killing herself. Says she feels safe going home and does not think there is a chance that she is going to try to harm herself. Shows good insight about her alcohol abuse. Still prefers to be discharged home and will follow-up at [facility].  At this point patient does not appear to meet commitment criteria. Patient will be taken off of IVC and can be discharged home with follow-up in the community. Case reviewed with TTS and emergency room physician.

## 2016-06-15 NOTE — ED Notes (Signed)
Dr.clapaas in room with patient. 

## 2016-06-15 NOTE — BH Assessment (Signed)
Assessment Note  Meredith Juarez is an 55 y.o. female. Meredith Juarez arrived to the ED by way of EMS.  She reports that one of her good close friends passed away, and she found them dead yesterday.  She states that she went to the store for him and when she came back he was dead in the bathroom.  She report that when she closes her eyes she sees him dead.  She denied current ideation of suicide.  She denied homicidal ideation or intent. She reports a history of depression and at this time she is very depressed. She states "I drunk a beer, because I was really hurt, I found my friend dead".  On arrival BAL=255. IVC paperwork reports "Depressed, Suicidal".  Diagnosis: Depression  Past Medical History:  Past Medical History:  Diagnosis Date  . Anemia   . Depression   . Diverticulitis   . GERD (gastroesophageal reflux disease)   . Hypertension   . Migraines   . Stroke ([place])   . Thyroid disease     History reviewed. No pertinent surgical history.  Family History:  Family History  Problem Relation Age of Onset  . Cancer Mother     Uterine, Breast, Ovarian  . Hypertension Mother   . Hypertension Father   . Bipolar disorder Father   . CAD Sister   . Hyperlipidemia Sister     Social History:  reports that she has been smoking Cigarettes.  She has been smoking about 0.25 packs per day. She has never used smokeless tobacco. She reports that she does not drink alcohol or use drugs.  Additional Social History:  Alcohol / Drug Use History of alcohol / drug use?: Yes (Alcohol use, would not elaborate )  CIWA: CIWA-Ar BP: 138/84 Pulse Rate: 82 COWS:    Allergies: No Known Allergies  Home Medications:  (Not in a hospital admission)  OB/GYN Status:  No LMP recorded (lmp unknown).  General Assessment Data Location of Assessment: [facility] ED TTS Assessment: In system Is this a Tele or Face-to-Face Assessment?: Face-to-Face Is this an Initial Assessment or a Re-assessment for this  encounter?: Initial Assessment Marital status: Single Maiden name: n/a Is patient pregnant?: No Pregnancy Status: No Living Arrangements: Non-relatives/Friends Can pt return to current living arrangement?: Yes Admission Status: Involuntary Is patient capable of signing voluntary admission?: Yes Referral Source: Self/Family/Friend Insurance type: None  Medical Screening Exam ([place]) Medical Exam completed: Yes  Crisis Care Plan Living Arrangements: Non-relatives/Friends Legal Guardian: Other: (Self) Name of Psychiatrist: Dr. Jamse Arn - [facility] Name of Therapist: None as yet  Education Status Is patient currently in school?: No Current Grade: n/a Highest grade of school patient has completed: GED Name of school: Unsure Contact person: n/a  Risk to self with the past 6 months Suicidal Ideation: No-Not Currently/Within Last 6 Months Has patient been a risk to self within the past 6 months prior to admission? : Yes Suicidal Intent: No-Not Currently/Within Last 6 Months Has patient had any suicidal intent within the past 6 months prior to admission? : Yes Is patient at risk for suicide?: No Suicidal Plan?: No Has patient had any suicidal plan within the past 6 months prior to admission? : Yes Access to Means: No What has been your use of drugs/alcohol within the last 12 months?: Reports use of alcohol, evidence of cocaine use Previous Attempts/Gestures: Yes How many times?: 2 Other Self Harm Risks: denied Triggers for Past Attempts: Unknown Intentional Self Injurious Behavior: None Family Suicide History:  No Recent stressful life event(s): Trauma (Comment) (found friend dead) Persecutory voices/beliefs?: No Depression: Yes Depression Symptoms: Despondent Substance abuse history and/or treatment for substance abuse?: No Suicide prevention information given to non-admitted patients: Not applicable  Risk to Others within the past 6 months Homicidal Ideation: No Does  patient have any lifetime risk of violence toward others beyond the six months prior to admission? : No Thoughts of Harm to Others: No Current Homicidal Intent: No Current Homicidal Plan: No Access to Homicidal Means: No Identified Victim: None identified History of harm to others?: No Assessment of Violence: None Noted Violent Behavior Description: n/a Does patient have access to weapons?: No Criminal Charges Pending?: No Does patient have a court date: No Is patient on probation?: No  Psychosis Hallucinations: Visual Delusions: None noted  Mental Status Report Appearance/Hygiene: In scrubs Eye Contact: Poor (looking at wall) Motor Activity: Unremarkable Speech: Slow Level of Consciousness: Drowsy, Irritable Mood: Irritable Affect: Depressed Anxiety Level: None Thought Processes: Coherent Judgement: Unable to Assess Orientation: Person, Place, Situation, Time Obsessive Compulsive Thoughts/Behaviors: None  Cognitive Functioning Concentration: Fair Memory: Recent Intact IQ: Average Insight: Fair Impulse Control: Fair Appetite: Fair Sleep: No Change Vegetative Symptoms: None  ADLScreening [facility] Assessment Services) Patient's cognitive ability adequate to safely complete daily activities?: Yes Patient able to express need for assistance with ADLs?: Yes Independently performs ADLs?: Yes (appropriate for developmental age)  Prior Inpatient Therapy Prior Inpatient Therapy: No (Patient refused to answer) Prior Therapy Dates:  (Unknown) Prior Therapy Facilty/Provider(s):  (Unknown) Reason for Treatment: Unknown  Prior Outpatient Therapy Prior Outpatient Therapy: Yes Prior Therapy Dates: Current Prior Therapy Facilty/Provider(s): [facility]-Dr. Jamse Arn Reason for Treatment: Depression Does patient have an ACCT team?: No Does patient have Intensive In-House Services?  : No Does patient have [facility] services? : No Does patient have P4CC services?: No  ADL Screening (condition  at time of admission) Patient's cognitive ability adequate to safely complete daily activities?: Yes Patient able to express need for assistance with ADLs?: Yes Independently performs ADLs?: Yes (appropriate for developmental age)       Abuse/Neglect Assessment (Assessment to be complete while patient is alone) Physical Abuse: Denies Verbal Abuse: Denies Sexual Abuse: Denies Exploitation of patient/patient's resources: Denies Self-Neglect: Denies          Additional Information 1:1 In Past 12 Months?: No CIRT Risk: No Elopement Risk: No Does patient have medical clearance?: Yes     Disposition:  Disposition Initial Assessment Completed for this Encounter: Yes Disposition of Patient: Other dispositions  On Site Evaluation by:   Reviewed with Physician:    Elmer Bales 06/15/2016 5:49 AM

## 2016-06-15 NOTE — ED Notes (Signed)
Secured pt's belongings: 1 pink lighter, 2 packs [place] cigarettes, 1 black jacket, 1 paid blue jeans, 1 pair black shoes, 1 pair black underwear, 1 tan bra, 2 white rings (in specimen cup), 1 gray sweater.  Assisted Kathrin Ruddy, NT with dress out.

## 2016-06-15 NOTE — ED Notes (Signed)
Gave meal tray

## 2016-07-28 ENCOUNTER — Other Ambulatory Visit: Payer: Self-pay | Admitting: Urology

## 2016-07-28 ENCOUNTER — Other Ambulatory Visit: Payer: Self-pay | Admitting: Internal Medicine

## 2016-08-01 ENCOUNTER — Emergency Department: Payer: Self-pay

## 2016-08-01 ENCOUNTER — Emergency Department
Admission: EM | Admit: 2016-08-01 | Discharge: 2016-08-01 | Disposition: A | Discharge status: Home / Self Care | Payer: Self-pay | Attending: Emergency Medicine | Admitting: Emergency Medicine

## 2016-08-01 DIAGNOSIS — F17210 Nicotine dependence, cigarettes, uncomplicated: Secondary | ICD-10-CM | POA: Insufficient documentation

## 2016-08-01 DIAGNOSIS — I639 Cerebral infarction, unspecified: Secondary | ICD-10-CM

## 2016-08-01 DIAGNOSIS — I10 Essential (primary) hypertension: Secondary | ICD-10-CM | POA: Insufficient documentation

## 2016-08-01 DIAGNOSIS — R51 Headache: Secondary | ICD-10-CM

## 2016-08-01 DIAGNOSIS — Z79899 Other long term (current) drug therapy: Secondary | ICD-10-CM | POA: Insufficient documentation

## 2016-08-01 DIAGNOSIS — M79601 Pain in right arm: Secondary | ICD-10-CM | POA: Insufficient documentation

## 2016-08-01 DIAGNOSIS — G43C1 Periodic headache syndromes in child or adult, intractable: Secondary | ICD-10-CM | POA: Insufficient documentation

## 2016-08-01 DIAGNOSIS — Z7982 Long term (current) use of aspirin: Secondary | ICD-10-CM | POA: Insufficient documentation

## 2016-08-01 DIAGNOSIS — M79603 Pain in arm, unspecified: Secondary | ICD-10-CM

## 2016-08-01 DIAGNOSIS — R519 Headache, unspecified: Secondary | ICD-10-CM

## 2016-08-01 LAB — URINALYSIS, COMPLETE (UACMP) WITH MICROSCOPIC
Bacteria, UA: NONE SEEN
Bilirubin Urine: NEGATIVE
Glucose, UA: NEGATIVE mg/dL
Hgb urine dipstick: NEGATIVE
Ketones, ur: 5 mg/dL — AB
LEUKOCYTES UA: NEGATIVE
Nitrite: NEGATIVE
PH: 5 (ref 5.0–8.0)
Protein, ur: NEGATIVE mg/dL
SPECIFIC GRAVITY, URINE: 1.015 (ref 1.005–1.030)

## 2016-08-01 LAB — COMPREHENSIVE METABOLIC PANEL
ALBUMIN: 3.6 g/dL (ref 3.5–5.0)
ALK PHOS: 65 U/L (ref 38–126)
ALT: 16 U/L (ref 14–54)
ANION GAP: 7 (ref 5–15)
AST: 21 U/L (ref 15–41)
BILIRUBIN TOTAL: 1 mg/dL (ref 0.3–1.2)
BUN: 14 mg/dL (ref 6–20)
CALCIUM: 9.1 mg/dL (ref 8.9–10.3)
CO2: 24 mmol/L (ref 22–32)
CREATININE: 0.69 mg/dL (ref 0.44–1.00)
Chloride: 109 mmol/L (ref 101–111)
GFR calc Af Amer: >60 mL/min (ref >60)
GFR calc non Af Amer: >60 mL/min (ref >60)
GLUCOSE: 90 mg/dL (ref 65–99)
Potassium: 3.9 mmol/L (ref 3.5–5.1)
Sodium: 140 mmol/L (ref 135–145)
TOTAL PROTEIN: 6.5 g/dL (ref 6.5–8.1)

## 2016-08-01 LAB — TROPONIN I: Troponin I: <0.03 ng/mL (ref <0.03)

## 2016-08-01 LAB — URINE DRUG SCREEN, QUALITATIVE (ARMC ONLY)
AMPHETAMINES, UR SCREEN: NOT DETECTED
Barbiturates, Ur Screen: NOT DETECTED
Benzodiazepine, Ur Scrn: NOT DETECTED
COCAINE METABOLITE, UR ~~LOC~~: NOT DETECTED
Cannabinoid 50 Ng, Ur ~~LOC~~: POSITIVE — AB
MDMA (ECSTASY) UR SCREEN: NOT DETECTED
METHADONE SCREEN, URINE: NOT DETECTED
OPIATE, UR SCREEN: NOT DETECTED
Phencyclidine (PCP) Ur S: NOT DETECTED
Tricyclic, Ur Screen: NOT DETECTED

## 2016-08-01 LAB — CBC WITH DIFFERENTIAL/PLATELET
Basophils Absolute: 0.1 10*3/uL (ref 0–0.1)
Basophils Relative: 1 %
EOS ABS: 0.1 10*3/uL (ref 0–0.7)
EOS PCT: 1 %
HCT: 44.4 % (ref 35.0–47.0)
HEMOGLOBIN: 14.6 g/dL (ref 12.0–16.0)
LYMPHS ABS: 1.9 10*3/uL (ref 1.0–3.6)
Lymphocytes Relative: 26 %
MCH: 27.8 pg (ref 26.0–34.0)
MCHC: 32.8 g/dL (ref 32.0–36.0)
MCV: 84.8 fL (ref 80.0–100.0)
MONOS PCT: 10 %
Monocytes Absolute: 0.7 10*3/uL (ref 0.2–0.9)
NEUTROS PCT: 62 %
Neutro Abs: 4.4 10*3/uL (ref 1.4–6.5)
Platelets: 281 10*3/uL (ref 150–440)
RBC: 5.24 MIL/uL — ABNORMAL HIGH (ref 3.80–5.20)
RDW: 14.3 % (ref 11.5–14.5)
WBC: 7.1 10*3/uL (ref 3.6–11.0)

## 2016-08-01 LAB — PROTIME-INR
INR: 0.92
PROTHROMBIN TIME: 12.3 s (ref 11.4–15.2)

## 2016-08-01 LAB — ETHANOL: Alcohol, Ethyl (B): <5 mg/dL (ref <5)

## 2016-08-01 MED ORDER — METOPROLOL TARTRATE 50 MG PO TABS: 50.0000 mg | ORAL_TABLET | Freq: Every day | ORAL | 0 refills | Status: DC

## 2016-08-01 MED ORDER — SODIUM CHLORIDE 0.9 % IV BOLUS (SEPSIS)
500.0000 mL | Freq: Once | INTRAVENOUS | Status: AC
Start: 2016-08-01 — End: 2016-08-01
  Administered 2016-08-01: 500 mL via INTRAVENOUS

## 2016-08-01 MED ORDER — METOPROLOL TARTRATE 50 MG PO TABS
50.0000 mg | ORAL_TABLET | Freq: Once | ORAL | Status: AC
  Administered 2016-08-01: 50 mg via ORAL
  Filled 2016-08-01: qty 1

## 2016-08-01 MED ORDER — LORAZEPAM 2 MG/ML IJ SOLN: 1.0000 mg | Freq: Once | INTRAMUSCULAR | Status: DC

## 2016-08-01 MED ORDER — ACETAMINOPHEN 325 MG PO TABS
650.0000 mg | ORAL_TABLET | Freq: Once | ORAL | Status: AC
  Administered 2016-08-01: 650 mg via ORAL
  Filled 2016-08-01: qty 2

## 2016-08-01 MED ORDER — HYDROCHLOROTHIAZIDE 25 MG PO TABS
25.0000 mg | ORAL_TABLET | Freq: Every day | ORAL | Status: DC
  Administered 2016-08-01: 25 mg via ORAL
  Filled 2016-08-01: qty 1

## 2016-08-01 MED ORDER — AMLODIPINE BESYLATE 5 MG PO TABS: 5.0000 mg | ORAL_TABLET | Freq: Every day | ORAL | 0 refills | Status: DC

## 2016-08-01 MED ORDER — CLOPIDOGREL BISULFATE 75 MG PO TABS: 75.0000 mg | ORAL_TABLET | Freq: Every day | ORAL | 0 refills | Status: DC

## 2016-08-01 MED ORDER — HYDROCHLOROTHIAZIDE 25 MG PO TABS: 25.0000 mg | ORAL_TABLET | Freq: Every day | ORAL | 0 refills | Status: DC

## 2016-08-01 MED ORDER — AMLODIPINE BESYLATE 5 MG PO TABS
5.0000 mg | ORAL_TABLET | Freq: Once | ORAL | Status: AC
  Administered 2016-08-01: 5 mg via ORAL
  Filled 2016-08-01: qty 1

## 2016-08-01 NOTE — ED Notes (Signed)
Called down to dietary for sandwich tray for pt.

## 2016-08-01 NOTE — ED Provider Notes (Addendum)
[facility] Emergency Department Provider Note  ____________________________________________   I have reviewed the triage vital signs and the nursing notes.   HISTORY  Chief Complaint No chief complaint on file.    HPI Meredith Juarez is a 56 y.o. female  with a history of a marked TIA? In the past per the patient was seen in 2014 with right-sided weakness and headache and had a CT scan and MRI that were reassuring. Patient states that she has had headaches for the last 5-10 years on a nearly daily basis. She's had a normal headache today. She also states she has some pain in her right shoulder but no chest pain or shortness of breath and she states that she is having weakness in her right arm tingling. This has been going on since partially noon she estimates although she is a very poor estimator of that time period. Her headache is not the worsening of life, gradual in onset. She denies any other focal numbness or weakness. She does have a history of migraines.  Does have a history of cocaine abuse in the past, she has a history of migraine headaches, she has a history of depression, she is not currently drinking a call she states. Headache was gradual onset, diffuse, same as all of her other headaches. Poorly described. Nothing makes it better nothing makes it worse. At this time however it's nearly gone   Past Medical History:  Diagnosis Date  . Anemia   . Depression   . Diverticulitis   . GERD (gastroesophageal reflux disease)   . Hypertension   . Migraines   . Stroke ([place])   . Thyroid disease     Patient Active Problem List   Diagnosis Date Noted  . Suicidal ideation 06/15/2016  . Alcohol abuse 06/15/2016  . 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 Hypokalemia 06/15/2016  . Migraines 09/05/2014  . Plantar fasciitis 09/05/2014  . Chest pain 01/01/2014  . Hypertension 01/01/2014    History reviewed. No  pertinent surgical history.  Prior to Admission medications   Medication Sig Start Date End Date Taking? Authorizing Provider  acetaminophen (TYLENOL) 500 MG tablet Take 500 mg by mouth every 4 (four) hours as needed.    Historical Provider, MD  amLODipine (NORVASC) 5 MG tablet TAKE ONE TABLET BY MOUTH EVERY DAY 10/13/15   Nori Riis, PA-C  aspirin EC 81 MG tablet Take 81 mg by mouth daily.    Historical Provider, MD  clopidogrel (PLAVIX) 75 MG tablet TAKE ONE TABLET BY MOUTH EVERY DAY. 10/13/15   Nori Riis, PA-C  hydrochlorothiazide (HYDRODIURIL) 25 MG tablet TAKE ONE TABLET BY MOUTH EVERY DAY. 10/13/15   Nori Riis, PA-C  metoprolol tartrate (LOPRESSOR) 25 MG tablet TAKE ONE TABLET BY MOUTH EVERY DAY. 10/13/15   Shannon A McGowan, PA-C  NITROSTAT 0.4 MG SL tablet DISSOLVE 1 TABLET UNDER TONGUE EVERY 5 MINUTES FOR CHEST PAIN. MAX 3 DOSES. IF NO RELIEF AFTER 1st DOSE, CALL 911. 10/13/15   Nori Riis, PA-C  nortriptyline (PAMELOR) 25 MG capsule Take 25 mg by mouth daily.    Historical Provider, MD  potassium chloride (K-DUR) 10 MEQ tablet Take 1 tablet (10 mEq total) by mouth daily. 06/15/16   Delman Kitten, MD  potassium chloride SA (K-DUR,KLOR-CON) 20 MEQ tablet TAKE 1 TABLET BY MOUTH EVERY DAY 10/13/15   Nori Riis, PA-C  QUEtiapine (SEROQUEL) 100 MG tablet Take 100 mg by mouth at  bedtime.    Floria Raveling, MD  ranitidine (ZANTAC) 150 MG tablet TAKE ONE TABLET BY MOUTH 2 TIMES A DAY. 10/13/15   Nori Riis, PA-C  sertraline (ZOLOFT) 100 MG tablet Take 100 mg by mouth daily.    Floria Raveling, MD  traZODone (DESYREL) 100 MG tablet Take 100 mg by mouth at bedtime. 1/2 - 1 tablet as needed    Floria Raveling, MD    Allergies Patient has no known allergies.  Family History  Problem Relation Age of Onset  . Cancer Mother     Uterine, Breast, Ovarian  . Hypertension Mother   . Hypertension Father   . Bipolar disorder Father   . CAD Sister   . Hyperlipidemia Sister      Social History Social History  Substance Use Topics  . Smoking status: Current Every Day Smoker    Packs/day: 0.25    Types: Cigarettes  . Smokeless tobacco: Never Used  . Alcohol use No    Review of Systems Constitutional: No fever/chills Eyes: No visual changes. ENT: No sore throat. No stiff neck no neck pain Cardiovascular: Denies chest pain. Respiratory: Denies shortness of breath. Gastrointestinal:   no vomiting.  No diarrhea.  No constipation. Genitourinary: Negative for dysuria. Musculoskeletal: Negative lower extremity swelling Skin: Negative for rash. Neurological: See history of present illness 10-point ROS otherwise negative.  ____________________________________________   PHYSICAL EXAM:  VITAL SIGNS: ED Triage Vitals [08/01/16 1554]  Enc Vitals Group     BP (!) 140/95     Pulse      Resp 16     Temp 98.1 F (36.7 C)     Temp Source Oral     SpO2 100 %     Weight 180 lb (81.6 kg)     Height 5\' 5"  (1.651 m)     Head Circumference      Peak Flow      Pain Score      Pain Loc      Pain Edu?      Excl. in [place]?     Constitutional: Alert and oriented. Well appearing and in no acute distress. Eyes: Conjunctivae are normal. PERRL. EOMI. Head: Atraumatic. Nose: No congestion/rhinnorhea. Mouth/Throat: Mucous membranes are moist.  Oropharynx non-erythematous. Neck: No stridor.   Nontender with no meningismus Cardiovascular: Normal rate, regular rhythm. Grossly normal heart sounds.  Good peripheral circulation. Respiratory: Normal respiratory effort.  No retractions. Lungs CTAB. Abdominal: Soft and nontender. No distention. No guarding no rebound Back:  There is no focal tenderness or step off.  there is no midline tenderness there are no lesions noted. there is no CVA tenderness Musculoskeletal: No lower extremity tenderness, no upper extremity tenderness. No joint effusions, no DVT signs strong distal pulses no edema Neurologic:  Normal speech and  language.Cranial nerves II through XII are grossly intact. Nose within normal limits heel to shin is normal, strength is normal bilateral lower extremity is, however good strength on the upper right is weak compared to the left. She does seem to have some drift on Monday but she states also the whole arm hurts when she picks it up. Skin:  Skin is warm, dry and intact. No rash noted. Psychiatric: Mood and affect are Somewhat anxious. Speech and behavior are Somewhat of a flat affect.  ____________________________________________   LABS (all labs ordered are listed, but only abnormal results are displayed)  Labs Reviewed  CBC WITH DIFFERENTIAL/PLATELET - Abnormal; Notable for the following:  Result Value   RBC 5.24 (*)    All other components within normal limits  URINE CULTURE  PROTIME-INR  COMPREHENSIVE METABOLIC PANEL  TROPONIN I  ETHANOL  URINALYSIS, COMPLETE (UACMP) WITH MICROSCOPIC  URINE DRUG SCREEN, QUALITATIVE ([facility] ONLY)   ____________________________________________  EKG  I personally interpreted any EKGs ordered by me or triage Sinus rhythm at 76 bpm no acute ST elevation or acute ST depression normal axis nonspecific ST changes ____________________________________________  RADIOLOGY  I reviewed any imaging ordered by me or triage that were performed during my shift and, if possible, patient and/or family made aware of any abnormal findings. ____________________________________________   PROCEDURES  Procedure(s) performed: None  Procedures  Critical Care performed: None  ____________________________________________   INITIAL IMPRESSION / ASSESSMENT AND PLAN / ED COURSE  Pertinent labs & imaging results that were available during my care of the patient were reviewed by me and considered in my medical decision making (see chart for details).  Patient here with chronic headaches which I do not believe likely recent bleed or meningitis. Also, complaining  of right upper extremity tenderness and weakness. Also tingling in that extremity. This is remarkably similar to her prior admission to the hospital for similar. She had extensive workup at that time which did not try any evidence of acute CVA. However, she was told she believes it was a TIA. Patient , at this time its 5:53 PM has no headache and feels much better. Migraine Associe certainly possible. She still has a grip strength weakness on the right versus the left. CT scan is reassuring, and the patient is not a candidate for TPA given that she believes her symptoms started approximately noon today.  ----------------------------------------- 8:18 PM on 08/01/2016 -----------------------------------------  Patient in no acute distress she seemed to be using her right arm with no difficulty but when I ask her to give me a grip strength, it is on less than the right. This seems to be some discordance between her exam and her complaints and the way she is using the arm. Review of prior records suggest that the patient was seen and evaluated extensively for exactly the same symptoms several years ago, 2014, and ultimately was referred to be behavioral health because of appears to have been thought to be possibly a fictitious disorder. It certainly could be a migraine of her associated event. She is in no acute distress she is ambulatory she is using her hand with no difficulty or evidence of weakness except for when I try to assess her for strength. I did discuss with neurology on-call, they feel the patient does not meet criteria for emergent admission and certainly not for TPA. They did ask for a diffusion weighted MRI if the patient persists with these symptoms such as they are. Patient has no SI or HI does not endorse depression. Given that I am having difficulty completely clearing her neurologically although I have low suspicion for acute stroke we will obtain MR. If that is negative, according to  neurology we can discharge her.  Dr. Irish Elders.   ----------------------------------------- 8:30 PM on 08/01/2016 -----------------------------------------  Signed out to dr. Cinda Quest at the end of my shift. ____________________________________________   FINAL CLINICAL IMPRESSION(S) / ED DIAGNOSES  Final diagnoses:  None      This chart was dictated using voice recognition software.  Despite best efforts to proofread,  errors can occur which can change meaning.      Schuyler Amor, MD 08/01/16 [phone]  Schuyler Amor, MD 08/01/16 1754    Schuyler Amor, MD 08/01/16 2020    Schuyler Amor, MD 08/01/16 2031

## 2016-08-01 NOTE — Discharge Instructions (Signed)
Please return if the symptoms return again. Please follow-up with the orthopedic doctor to check on your arm pain. [facility] so that you can get your prescriptions that I wrote for you filled.

## 2016-08-01 NOTE — ED Notes (Signed)
Pt provided with meal tray. Pt states "it's about time, ain't nobody feedin' nobody in here. I asked two hours ago and that bitch told me they busy they got emergencies, ain't no staff around to feed me. They all in there workin' on they's people, bullshit."

## 2016-08-01 NOTE — ED Triage Notes (Signed)
Pt came to ED via EMS from home c/o headache, describes the pain in temple area. Has also been out of metoporol for 1 week. BP 140/95, HR90

## 2016-08-01 NOTE — ED Provider Notes (Addendum)
Study Result   CLINICAL DATA:  Patient is inially here for headache pain. She also states she has some pain in her right shoulder but no chest pain or shortness of breath and she states that she is having weakness in her right arm tingling. Symptoms since noon.  EXAM: RIGHT SHOULDER - 2+ VIEW  COMPARISON:  None.  FINDINGS: No evidence for acute fracture or dislocation. Mild glenohumeral degenerative changes. Study quality is degraded by patient motion artifact.  IMPRESSION: No evidence for acute  abnormality.   Electronically Signed   By: Nolon Nations M.D.   On: 08/01/2016 21:04    Study Result   CLINICAL DATA:  Right arm numbness and weakness.  Temporal headache.  EXAM: MRA HEAD WITHOUT CONTRAST  TECHNIQUE: Angiographic images of the Circle of Willis were obtained using MRA technique without intravenous contrast.  COMPARISON:  05/07/2013 CTA  FINDINGS: Symmetric carotid and vertebral arteries. No major branch occlusion or flow limiting stenosis. Poor signal in the bilateral operculum vessels is attributed to artifact at the end of imaging slab. Negative for aneurysm or beading. Tiny outpouching from the supraclinoid right ICA is and infundibulum, confirmed on prior CTA.  IMPRESSION: Negative intracranial MRA.   Electronically Signed   By: Monte Fantasia M.D.   On: 08/01/2016 21:44    Radiologist calls and says we were worried about stroke and irregular MRI. This was ordered.        Nena Polio, MD 08/01/16 2236   Patient refuses the MRI says she cannot stand being in the machine again. She is aware of the fact that she may have had a stroke or TIA. She says factor except for the pain in her arm everything else is back to normal. She again refuses the MRI explained to her in detail that we can give her some medicine to help her relax that she will even noticed the MRI is going on but she refuses again and again. Then she says she is  not sure why her blood pressure is so high but she ran out of all of her pressure medicine and the CT medicine for her blood thinner. She says she usually gets her prescriptions from [place]. [facility] is now closed over at least I can refill her prescriptions. Blood pressure present is okay 138/81. Since I cannot talk the patient into getting the MRI I will discharge her with follow-up with the orthopedic doctor for her shoulder pain and she will [facility]. She will return if she is worse or at least I instructed her to do so.    Nena Polio, MD 08/01/16 [phone]

## 2016-08-01 NOTE — ED Notes (Signed)
Pt to mri 

## 2016-08-01 NOTE — ED Notes (Signed)
Pt returned from mri and ambulatory at this time to restroom.

## 2016-08-01 NOTE — ED Notes (Signed)
Report from felicia, rn.  

## 2016-08-01 NOTE — ED Notes (Signed)
Mri technician, diane called regarding mri.

## 2016-08-03 LAB — URINE CULTURE

## 2016-11-05 ENCOUNTER — Emergency Department
Admission: EM | Admit: 2016-11-05 | Discharge: 2016-11-06 | Disposition: A | Discharge status: Other Acute Inpatient Hospital | Payer: Self-pay | Attending: Emergency Medicine | Admitting: Emergency Medicine

## 2016-11-05 ENCOUNTER — Encounter: Payer: Self-pay | Admitting: Emergency Medicine

## 2016-11-05 DIAGNOSIS — R45851 Suicidal ideations: Secondary | ICD-10-CM

## 2016-11-05 DIAGNOSIS — F17210 Nicotine dependence, cigarettes, uncomplicated: Secondary | ICD-10-CM | POA: Insufficient documentation

## 2016-11-05 DIAGNOSIS — R45850 Homicidal ideations: Secondary | ICD-10-CM

## 2016-11-05 DIAGNOSIS — F319 Bipolar disorder, unspecified: Secondary | ICD-10-CM | POA: Insufficient documentation

## 2016-11-05 DIAGNOSIS — I10 Essential (primary) hypertension: Secondary | ICD-10-CM | POA: Insufficient documentation

## 2016-11-05 NOTE — ED Notes (Addendum)
Pt changed into behavorial clothing by this tech, pt has a grey and black stripped shirt, black shorts, grey colored hoop ear rings, grey colored infinity ring, black flat slip on shoes, black underwear, and tan bra

## 2016-11-05 NOTE — ED Triage Notes (Signed)
Pt arrives via [facility] IVC'd for HI. Pt denies HI at this time in triage but was being verbally aggressive at time of check in. Pt is ambulatory and in NAD at this time.

## 2016-11-06 ENCOUNTER — Inpatient Hospital Stay
Admission: AD | Admit: 2016-11-06 | Discharge: 2016-11-10 | DRG: 885 | Disposition: A | Discharge status: Home / Self Care | Payer: No Typology Code available for payment source | Attending: Psychiatry | Admitting: Psychiatry

## 2016-11-06 DIAGNOSIS — Y908 Blood alcohol level of 240 mg/100 ml or more: Secondary | ICD-10-CM | POA: Diagnosis present

## 2016-11-06 DIAGNOSIS — F332 Major depressive disorder, recurrent severe without psychotic features: Secondary | ICD-10-CM | POA: Diagnosis present

## 2016-11-06 DIAGNOSIS — E876 Hypokalemia: Secondary | ICD-10-CM | POA: Diagnosis present

## 2016-11-06 DIAGNOSIS — R45851 Suicidal ideations: Secondary | ICD-10-CM | POA: Diagnosis present

## 2016-11-06 DIAGNOSIS — I10 Essential (primary) hypertension: Secondary | ICD-10-CM | POA: Diagnosis present

## 2016-11-06 DIAGNOSIS — I25119 Atherosclerotic heart disease of native coronary artery with unspecified angina pectoris: Secondary | ICD-10-CM | POA: Diagnosis present

## 2016-11-06 DIAGNOSIS — G43909 Migraine, unspecified, not intractable, without status migrainosus: Secondary | ICD-10-CM | POA: Diagnosis present

## 2016-11-06 DIAGNOSIS — F1020 Alcohol dependence, uncomplicated: Secondary | ICD-10-CM | POA: Diagnosis present

## 2016-11-06 DIAGNOSIS — Z8673 Personal history of transient ischemic attack (TIA), and cerebral infarction without residual deficits: Secondary | ICD-10-CM | POA: Diagnosis not present

## 2016-11-06 DIAGNOSIS — G4700 Insomnia, unspecified: Secondary | ICD-10-CM | POA: Diagnosis present

## 2016-11-06 DIAGNOSIS — F17210 Nicotine dependence, cigarettes, uncomplicated: Secondary | ICD-10-CM | POA: Diagnosis present

## 2016-11-06 DIAGNOSIS — R45850 Homicidal ideations: Secondary | ICD-10-CM | POA: Diagnosis present

## 2016-11-06 DIAGNOSIS — K219 Gastro-esophageal reflux disease without esophagitis: Secondary | ICD-10-CM | POA: Diagnosis present

## 2016-11-06 DIAGNOSIS — F17200 Nicotine dependence, unspecified, uncomplicated: Secondary | ICD-10-CM | POA: Diagnosis present

## 2016-11-06 LAB — URINALYSIS, COMPLETE (UACMP) WITH MICROSCOPIC
BILIRUBIN URINE: NEGATIVE
Bacteria, UA: NONE SEEN
GLUCOSE, UA: NEGATIVE mg/dL
Hgb urine dipstick: NEGATIVE
KETONES UR: NEGATIVE mg/dL
LEUKOCYTES UA: NEGATIVE
Nitrite: NEGATIVE
PH: 5 (ref 5.0–8.0)
PROTEIN: NEGATIVE mg/dL
RBC / HPF: NONE SEEN RBC/hpf (ref 0–5)
Specific Gravity, Urine: 1.014 (ref 1.005–1.030)

## 2016-11-06 LAB — COMPREHENSIVE METABOLIC PANEL
ALBUMIN: 4.5 g/dL (ref 3.5–5.0)
ALK PHOS: 77 U/L (ref 38–126)
ALT: 20 U/L (ref 14–54)
AST: 21 U/L (ref 15–41)
Anion gap: 12 (ref 5–15)
BUN: 9 mg/dL (ref 6–20)
CHLORIDE: 107 mmol/L (ref 101–111)
CO2: 23 mmol/L (ref 22–32)
Calcium: 9.4 mg/dL (ref 8.9–10.3)
Creatinine, Ser: 0.72 mg/dL (ref 0.44–1.00)
GFR calc non Af Amer: >60 mL/min (ref >60)
GLUCOSE: 107 mg/dL — AB (ref 65–99)
POTASSIUM: 3 mmol/L — AB (ref 3.5–5.1)
SODIUM: 142 mmol/L (ref 135–145)
Total Bilirubin: 0.6 mg/dL (ref 0.3–1.2)
Total Protein: 8 g/dL (ref 6.5–8.1)

## 2016-11-06 LAB — CBC
HEMATOCRIT: 46.8 % (ref 35.0–47.0)
Hemoglobin: 15.9 g/dL (ref 12.0–16.0)
MCH: 28.2 pg (ref 26.0–34.0)
MCHC: 33.8 g/dL (ref 32.0–36.0)
MCV: 83.2 fL (ref 80.0–100.0)
PLATELETS: 321 10*3/uL (ref 150–440)
RBC: 5.63 MIL/uL — AB (ref 3.80–5.20)
RDW: 15.1 % — ABNORMAL HIGH (ref 11.5–14.5)
WBC: 6.7 10*3/uL (ref 3.6–11.0)

## 2016-11-06 LAB — ACETAMINOPHEN LEVEL: Acetaminophen (Tylenol), Serum: <10 ug/mL — ABNORMAL LOW (ref 10–30)

## 2016-11-06 LAB — URINE DRUG SCREEN, QUALITATIVE (ARMC ONLY)
AMPHETAMINES, UR SCREEN: NOT DETECTED
Barbiturates, Ur Screen: NOT DETECTED
Benzodiazepine, Ur Scrn: NOT DETECTED
Cannabinoid 50 Ng, Ur ~~LOC~~: NOT DETECTED
Cocaine Metabolite,Ur ~~LOC~~: NOT DETECTED
MDMA (ECSTASY) UR SCREEN: NOT DETECTED
METHADONE SCREEN, URINE: NOT DETECTED
OPIATE, UR SCREEN: NOT DETECTED
PHENCYCLIDINE (PCP) UR S: NOT DETECTED
Tricyclic, Ur Screen: NOT DETECTED

## 2016-11-06 LAB — SALICYLATE LEVEL

## 2016-11-06 LAB — ETHANOL: Alcohol, Ethyl (B): 245 mg/dL — ABNORMAL HIGH (ref <5)

## 2016-11-06 MED ORDER — AMLODIPINE BESYLATE 5 MG PO TABS
5.0000 mg | ORAL_TABLET | Freq: Every day | ORAL | Status: DC
  Administered 2016-11-07: 5 mg via ORAL
  Filled 2016-11-06: qty 1

## 2016-11-06 MED ORDER — NITROGLYCERIN 0.4 MG SL SUBL: 0.4000 mg | SUBLINGUAL_TABLET | SUBLINGUAL | Status: DC | PRN

## 2016-11-06 MED ORDER — ALUM & MAG HYDROXIDE-SIMETH 200-200-20 MG/5ML PO SUSP: 20.0000 mL | ORAL | Status: DC | PRN

## 2016-11-06 MED ORDER — CLOPIDOGREL BISULFATE 75 MG PO TABS
75.0000 mg | ORAL_TABLET | Freq: Every day | ORAL | Status: DC
  Administered 2016-11-06 – 2016-11-10 (×5): 75 mg via ORAL
  Filled 2016-11-06 (×5): qty 1

## 2016-11-06 MED ORDER — MAGNESIUM HYDROXIDE 400 MG/5ML PO SUSP: 30.0000 mL | Freq: Every day | ORAL | Status: DC | PRN

## 2016-11-06 MED ORDER — ASPIRIN EC 81 MG PO TBEC
81.0000 mg | DELAYED_RELEASE_TABLET | Freq: Every day | ORAL | Status: DC
  Administered 2016-11-06 – 2016-11-10 (×5): 81 mg via ORAL
  Filled 2016-11-06 (×6): qty 1

## 2016-11-06 MED ORDER — HYDROCHLOROTHIAZIDE 25 MG PO TABS
25.0000 mg | ORAL_TABLET | Freq: Every day | ORAL | Status: DC
  Administered 2016-11-07 – 2016-11-10 (×4): 25 mg via ORAL
  Filled 2016-11-06 (×4): qty 1

## 2016-11-06 MED ORDER — FAMOTIDINE 20 MG PO TABS
20.0000 mg | ORAL_TABLET | Freq: Two times a day (BID) | ORAL | Status: DC
  Administered 2016-11-06 – 2016-11-10 (×8): 20 mg via ORAL
  Filled 2016-11-06 (×8): qty 1

## 2016-11-06 MED ORDER — ACETAMINOPHEN 325 MG PO TABS: 650.0000 mg | ORAL_TABLET | Freq: Four times a day (QID) | ORAL | Status: DC | PRN

## 2016-11-06 MED ORDER — POTASSIUM CHLORIDE CRYS ER 20 MEQ PO TBCR
20.0000 meq | EXTENDED_RELEASE_TABLET | Freq: Every day | ORAL | Status: DC
  Administered 2016-11-06 – 2016-11-10 (×5): 20 meq via ORAL
  Filled 2016-11-06 (×7): qty 1

## 2016-11-06 MED ORDER — SERTRALINE HCL 100 MG PO TABS
100.0000 mg | ORAL_TABLET | Freq: Every day | ORAL | Status: DC
  Administered 2016-11-06: 50 mg via ORAL
  Administered 2016-11-07 – 2016-11-10 (×4): 100 mg via ORAL
  Filled 2016-11-06 (×5): qty 1

## 2016-11-06 MED ORDER — METOPROLOL TARTRATE 25 MG PO TABS
50.0000 mg | ORAL_TABLET | Freq: Every day | ORAL | Status: DC
  Administered 2016-11-06 – 2016-11-07 (×2): 50 mg via ORAL
  Filled 2016-11-06 (×2): qty 2

## 2016-11-06 MED ORDER — TRAZODONE HCL 50 MG PO TABS: 50.0000 mg | ORAL_TABLET | Freq: Every evening | ORAL | Status: DC | PRN

## 2016-11-06 MED ORDER — QUETIAPINE FUMARATE 100 MG PO TABS
100.0000 mg | ORAL_TABLET | Freq: Every day | ORAL | Status: DC
  Administered 2016-11-06 – 2016-11-08 (×3): 100 mg via ORAL
  Filled 2016-11-06 (×2): qty 1

## 2016-11-06 NOTE — [facility] Group Notes (Signed)
[facility] Group Notes:  (Nursing/MHT/Case Management/Adjunct)  Date:  11/06/2016  Time:  11:07 PM  Type of Therapy:  Group Therapy  Participation Level:  Did Not Attend  Participation Quality Summary of Progress/Problems:  Nehemiah Settle 11/06/2016, 11:07 PM

## 2016-11-06 NOTE — ED Notes (Addendum)
Pt provided urine specimen. "I still don't feel good." She declined to answer questions. "I don't want to talk right now." Pt went back to bed. Pt was given a Coke. Pt remains safe with camera monitoring and safety checks. Will continue to monitor.
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
## 2016-11-06 NOTE — Tx Team (Signed)
Initial Treatment Plan 11/06/2016 6:42 PM [name] AYO:[id number]    PATIENT STRESSORS: Financial difficulties Substance abuse Traumatic event   PATIENT STRENGTHS: Average or above average intelligence Communication skills Physical Health   PATIENT IDENTIFIED PROBLEMS: Homicidal ideations 11/06/2016  Suicidal ideations 11/06/2016                   DISCHARGE CRITERIA:  Ability to meet basic life and health needs Adequate post-discharge living arrangements Medical problems require only outpatient monitoring  PRELIMINARY DISCHARGE PLAN: Attend aftercare/continuing care group Return to previous living arrangement  PATIENT/FAMILY INVOLVEMENT: This treatment plan has been presented to and reviewed with the patient, Meredith Juarez, and/or family member,   The patient and family have been given the opportunity to ask questions and make suggestions.  Merlene Morse, RN 11/06/2016, 6:42 PM

## 2016-11-06 NOTE — ED Notes (Signed)
ENVIRONMENTAL ASSESSMENT  Potentially harmful objects out of patient reach: Yes.  Personal belongings secured: Yes.  Patient dressed in hospital provided attire only: Yes.  Plastic bags out of patient reach: Yes.  Patient care equipment (cords, cables, call bells, lines, and drains) shortened, removed, or accounted for: Yes.  Equipment and supplies removed from bottom of stretcher: Yes.  Potentially toxic materials out of patient reach: Yes.  Sharps container removed or out of patient reach: Yes.   BEHAVIORAL HEALTH ROUNDING  Patient sleeping: No.  Patient alert and oriented: yes  Behavior appropriate: Yes. ; If no, describe:  Nutrition and fluids offered: Yes  Toileting and hygiene offered: Yes  Sitter present: not applicable, Q 15 min safety rounds and observation via security camera. Law enforcement present: Yes ODS  Pt brought into ED BHU via sally port and wand with metal detector for safety by ODS officer. Patient oriented to unit/care area: Pt informed of unit policies and procedures.  Informed that, for their safety, care areas are designed for safety and monitored by security cameras at all times; and visiting hours explained to patient. Patient verbalizes understanding, and verbal contract for safety obtained.Pt shown to their room.    

## 2016-11-06 NOTE — Plan of Care (Signed)
Problem: Coping: Goal: Ability to identify and develop effective coping behavior will improve Outcome: Progressing Patient has a sad affect and has minimal interaction with staff.

## 2016-11-06 NOTE — Progress Notes (Signed)
Patient came out of her room and was observed in the dayroom talking to peers early in shift.  She was appropriate on contact with staff but had little to say.  She stated her goal for this evening is "to get some sleep".  She was given support and information.

## 2016-11-06 NOTE — Progress Notes (Signed)
TTS was unable to assess the patient as she was unable to be aroused to complete the assessment.

## 2016-11-06 NOTE — ED Notes (Signed)
BEHAVIORAL HEALTH ROUNDING  Patient sleeping: No.  Patient alert and oriented: yes  Behavior appropriate: Yes. ; If no, describe:  Nutrition and fluids offered: Yes  Toileting and hygiene offered: Yes  Sitter present: not applicable, Q 15 min safety rounds and observation via security camera. Law enforcement present: Yes ODS  Pt up to bathroom. Specimen requested. Pt only able to catch small amt. Will take to lab.
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
## 2016-11-06 NOTE — Progress Notes (Signed)
Patient is irritable and does not want to answer many questions during admission assessment. Patient denies SI/HI at this time. Patient denies AVH. Patient informed of fall risk status, fall risk assessed "low" at this time. Patient oriented to unit/staff/room. Patient denies any questions/concerns at this time. Patient safe on unit with Q15 minute checks for safety. Skin assessment & body search done,no contraband found.
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
## 2016-11-06 NOTE — ED Notes (Signed)
BEHAVIORAL HEALTH ROUNDING Patient sleeping: Yes.   Patient alert and oriented: not applicable SLEEPING Behavior appropriate: Yes.  ; If no, describe: SLEEPING Nutrition and fluids offered: No SLEEPING Toileting and hygiene offered: NoSLEEPING Sitter present: not applicable, Q 15 min safety rounds and observation via security camera. Law enforcement present: Yes ODS 

## 2016-11-06 NOTE — BH Assessment (Signed)
Patient is to be admitted to [facility] by Dr. Alonna Minium.  Attending Physician will be Dr. Bary Leriche.   Patient has been assigned to room 301, by [place].   Intake Paper Work has been signed and placed on patient chart.  ER staff is aware of the admission Edd Arbour, ER Sect.; Dr. Jacqualine Code, ER MD; Benjamine Mola, Patient's Nurse & Levada Dy, Patient Access).

## 2016-11-06 NOTE — ED Notes (Signed)
Report given to Gigi RN.

## 2016-11-06 NOTE — ED Notes (Signed)
Pt snoring loudly in hallway bed, even respirations

## 2016-11-07 DIAGNOSIS — F17200 Nicotine dependence, unspecified, uncomplicated: Secondary | ICD-10-CM | POA: Diagnosis present

## 2016-11-07 DIAGNOSIS — F332 Major depressive disorder, recurrent severe without psychotic features: Principal | ICD-10-CM

## 2016-11-07 DIAGNOSIS — F17210 Nicotine dependence, cigarettes, uncomplicated: Secondary | ICD-10-CM | POA: Diagnosis present

## 2016-11-07 DIAGNOSIS — K219 Gastro-esophageal reflux disease without esophagitis: Secondary | ICD-10-CM | POA: Diagnosis present

## 2016-11-07 DIAGNOSIS — F1020 Alcohol dependence, uncomplicated: Secondary | ICD-10-CM | POA: Diagnosis present

## 2016-11-07 MED ORDER — AMLODIPINE BESYLATE 5 MG PO TABS
10.0000 mg | ORAL_TABLET | Freq: Every day | ORAL | Status: DC
  Administered 2016-11-08 – 2016-11-10 (×3): 10 mg via ORAL
  Filled 2016-11-07 (×3): qty 2

## 2016-11-07 MED ORDER — METOPROLOL TARTRATE 25 MG PO TABS
25.0000 mg | ORAL_TABLET | Freq: Two times a day (BID) | ORAL | Status: DC
  Administered 2016-11-07 – 2016-11-10 (×6): 25 mg via ORAL
  Filled 2016-11-07 (×7): qty 1

## 2016-11-07 MED ORDER — AMLODIPINE BESYLATE 5 MG PO TABS
5.0000 mg | ORAL_TABLET | Freq: Once | ORAL | Status: AC
  Administered 2016-11-07: 5 mg via ORAL
  Filled 2016-11-07: qty 1

## 2016-11-07 MED ORDER — NICOTINE 14 MG/24HR TD PT24
14.0000 mg | MEDICATED_PATCH | Freq: Every day | TRANSDERMAL | Status: DC
  Administered 2016-11-07 – 2016-11-09 (×3): 14 mg via TRANSDERMAL
  Filled 2016-11-07 (×5): qty 1

## 2016-11-07 NOTE — Progress Notes (Signed)
Meredith Juarez was observed in bed, awake, and in a dark room during shift rounds.  She denies SI/HI/AVH and stated she was "just thinking about her Mother, her son, and family.  She denied needing to talk but contracted for safety.  She was pleasant during medication pass.

## 2016-11-07 NOTE — Plan of Care (Signed)
Problem: Coping: Goal: Ability to cope will improve Outcome: Progressing Patient stays to herself but engages on approach.

## 2016-11-07 NOTE — [facility] Group Notes (Signed)
[place] LCSW Group Therapy   11/07/2016 1 PM   Type of Therapy: Group Therapy   Participation Level: Patient invited but did not attend.   Glorious Peach, MSW, [place] 11/07/2016, 3:00 PM

## 2016-11-07 NOTE — [facility] Counselor (Signed)
CSW attempted to meet with patient. Patient stated that "[she] does not feel good and would like to meet at a later time." CSW will attempt to complete psychosocial assessment at later time.    Glorious Peach, MSW, LCSW-A 11/07/2016, 9:29AM
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
## 2016-11-07 NOTE — Progress Notes (Signed)
D: Pt remains isolative and a little irritable today. She participates minimally in assessment, when at all, and declines to answer more than a few questions. She does deny SI ("not right now") but suggests some passive SI earlier. She contracts while on the unit. She's spent most of the day in bed and says she is very tired. She did not want to get up for lunch -- "Can I get it later?" -- but did when reminded of how leftovers might taste.   A: Meds given as ordered. Q15 safety checks maintained. Support/encouragement offered.  R: Pt remains free from harm and continues with treatment. Will continue to monitor for needs/safety.
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
## 2016-11-08 ENCOUNTER — Encounter: Payer: Self-pay | Admitting: Psychiatry

## 2016-11-08 LAB — LIPID PANEL
CHOL/HDL RATIO: 3.8 ratio
CHOLESTEROL: 229 mg/dL — AB (ref 0–200)
HDL: 61 mg/dL (ref >40)
LDL CALC: 127 mg/dL — AB (ref 0–99)
TRIGLYCERIDES: 207 mg/dL — AB (ref <150)
VLDL: 41 mg/dL — AB (ref 0–40)

## 2016-11-08 LAB — TSH: TSH: 7.468 u[IU]/mL — ABNORMAL HIGH (ref 0.350–4.500)
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
## 2016-11-08 NOTE — Tx Team (Signed)
Interdisciplinary Treatment and Diagnostic Plan Update  11/08/2016 Time of Session: 10:30 AM Meredith Juarez MRN: [id number]  Principal Diagnosis: Severe episode of recurrent major depressive disorder, without psychotic features ([place])  Secondary Diagnoses: Principal Problem:   Severe episode of recurrent major depressive disorder, without psychotic features ([place]) Active Problems:   Migraines   Hypertension   GERD (gastroesophageal reflux disease)   Tobacco use disorder   Alcohol use disorder, severe, dependence ([place])   Current Medications:  Current Facility-Administered Medications  Medication Dose Route Frequency Provider Last Rate Last Dose  . acetaminophen (TYLENOL) tablet 650 mg  650 mg Oral Q6H PRN Lenward Chancellor, MD      . alum & mag hydroxide-simeth (MAALOX/MYLANTA) 200-200-20 MG/5ML suspension 20 mL  20 mL Oral Q4H PRN Lenward Chancellor, MD      . amLODipine (NORVASC) tablet 10 mg  10 mg Oral Daily Hildred Priest, MD   10 mg at 11/08/16 0824  . aspirin EC tablet 81 mg  81 mg Oral Daily Lenward Chancellor, MD   81 mg at 11/08/16 0824  . clopidogrel (PLAVIX) tablet 75 mg  75 mg Oral Daily Lenward Chancellor, MD   75 mg at 11/08/16 0824  . famotidine (PEPCID) tablet 20 mg  20 mg Oral BID Lenward Chancellor, MD   20 mg at 11/08/16 6599  . hydrochlorothiazide (HYDRODIURIL) tablet 25 mg  25 mg Oral Daily Lenward Chancellor, MD   25 mg at 11/08/16 3570  . magnesium hydroxide (MILK OF MAGNESIA) suspension 30 mL  30 mL Oral Daily PRN Lenward Chancellor, MD      . metoprolol tartrate (LOPRESSOR) tablet 25 mg  25 mg Oral BID Hildred Priest, MD   25 mg at 11/08/16 0824  . nicotine (NICODERM CQ - dosed in mg/24 hours) patch 14 mg  14 mg Transdermal Daily Pucilowska, Jolanta B, MD   14 mg at 11/08/16 0824  . nitroGLYCERIN (NITROSTAT) SL tablet 0.4 mg  0.4 mg Sublingual Q5 min PRN Lenward Chancellor, MD      . potassium chloride SA (K-DUR,KLOR-CON) CR tablet 20 mEq  20 mEq  Oral Daily Lenward Chancellor, MD   20 mEq at 11/08/16 0823  . QUEtiapine (SEROQUEL) tablet 100 mg  100 mg Oral QHS Lenward Chancellor, MD   100 mg at 11/07/16 2210  . sertraline (ZOLOFT) tablet 100 mg  100 mg Oral Daily Lenward Chancellor, MD   100 mg at 11/08/16 0824  . traZODone (DESYREL) tablet 50 mg  50 mg Oral QHS PRN Lenward Chancellor, MD       PTA Medications: Prescriptions Prior to Admission  Medication Sig Dispense Refill Last Dose  . acetaminophen (TYLENOL) 500 MG tablet Take 500 mg by mouth every 4 (four) hours as needed.   prn at prn  . amLODipine (NORVASC) 5 MG tablet TAKE ONE TABLET BY MOUTH EVERY DAY 90 tablet 0 Taking  . amLODipine (NORVASC) 5 MG tablet Take 1 tablet (5 mg total) by mouth daily. 30 tablet 0   . aspirin EC 81 MG tablet Take 81 mg by mouth daily.   Taking  . clopidogrel (PLAVIX) 75 MG tablet TAKE ONE TABLET BY MOUTH EVERY DAY. 90 tablet 0 Taking  . clopidogrel (PLAVIX) 75 MG tablet Take 1 tablet (75 mg total) by mouth daily. 30 tablet 0   . hydrochlorothiazide (HYDRODIURIL) 25 MG tablet TAKE ONE TABLET BY MOUTH EVERY DAY. 90 tablet 0   . hydrochlorothiazide (HYDRODIURIL) 25 MG tablet Take 1 tablet (25 mg total) by mouth daily.  30 tablet 0   . metoprolol (LOPRESSOR) 50 MG tablet Take 1 tablet (50 mg total) by mouth daily. 30 tablet 0   . metoprolol tartrate (LOPRESSOR) 25 MG tablet TAKE ONE TABLET BY MOUTH EVERY DAY. 90 tablet 0 Taking  . NITROSTAT 0.4 MG SL tablet DISSOLVE 1 TABLET UNDER TONGUE EVERY 5 MINUTES FOR CHEST PAIN. MAX 3 DOSES. IF NO RELIEF AFTER 1st DOSE, CALL 911. 25 tablet 0 prn at prn  . nortriptyline (PAMELOR) 25 MG capsule Take 25 mg by mouth daily.   unknown at unknown  . potassium chloride (K-DUR) 10 MEQ tablet Take 1 tablet (10 mEq total) by mouth daily. 5 tablet 0   . potassium chloride SA (K-DUR,KLOR-CON) 20 MEQ tablet TAKE 1 TABLET BY MOUTH EVERY DAY 90 tablet 0 Taking  . QUEtiapine (SEROQUEL) 100 MG tablet Take 100 mg by mouth at bedtime.    unknown at unknown  . ranitidine (ZANTAC) 150 MG tablet TAKE ONE TABLET BY MOUTH 2 TIMES A DAY. 180 tablet 0 Taking  . sertraline (ZOLOFT) 100 MG tablet Take 100 mg by mouth daily.   unknown at unknown  . traZODone (DESYREL) 100 MG tablet Take 100 mg by mouth at bedtime. 1/2 - 1 tablet as needed   unknown at unknown    Patient Stressors: Financial difficulties Substance abuse Traumatic event  Patient Strengths: Average or above average intelligence Communication skills Physical Health  Treatment Modalities: Medication Management, Group therapy, Case management,  1 to 1 session with clinician, Psychoeducation, Recreational therapy.   Physician Treatment Plan for Primary Diagnosis: Severe episode of recurrent major depressive disorder, without psychotic features ([place]) Long Term Goal(s): Improvement in symptoms so as ready for discharge Improvement in symptoms so as ready for discharge   Short Term Goals: Ability to identify changes in lifestyle to reduce recurrence of condition will improve Compliance with prescribed medications will improve Ability to disclose and discuss suicidal ideas Ability to demonstrate self-control will improve Ability to identify and develop effective coping behaviors will improve Ability to identify triggers associated with substance abuse/mental health issues will improve  Medication Management: Evaluate patient's response, side effects, and tolerance of medication regimen.  Therapeutic Interventions: 1 to 1 sessions, Unit Group sessions and Medication administration.  Evaluation of Outcomes: Progressing  Physician Treatment Plan for Secondary Diagnosis: Principal Problem:   Severe episode of recurrent major depressive disorder, without psychotic features ([place]) Active Problems:   Migraines   Hypertension   GERD (gastroesophageal reflux disease)   Tobacco use disorder   Alcohol use disorder, severe, dependence ([place])  Long Term Goal(s): Improvement  in symptoms so as ready for discharge Improvement in symptoms so as ready for discharge   Short Term Goals: Ability to identify changes in lifestyle to reduce recurrence of condition will improve Compliance with prescribed medications will improve Ability to disclose and discuss suicidal ideas Ability to demonstrate self-control will improve Ability to identify and develop effective coping behaviors will improve Ability to identify triggers associated with substance abuse/mental health issues will improve     Medication Management: Evaluate patient's response, side effects, and tolerance of medication regimen.  Therapeutic Interventions: 1 to 1 sessions, Unit Group sessions and Medication administration.  Evaluation of Outcomes: Progressing   RN Treatment Plan for Primary Diagnosis: Severe episode of recurrent major depressive disorder, without psychotic features ([place]) Long Term Goal(s): Knowledge of disease and therapeutic regimen to maintain health will improve  Short Term Goals: Ability to remain free from injury will improve, Ability to  verbalize frustration and anger appropriately will improve, Ability to demonstrate self-control and Compliance with prescribed medications will improve  Medication Management: RN will administer medications as ordered by provider, will assess and evaluate patient's response and provide education to patient for prescribed medication. RN will report any adverse and/or side effects to prescribing provider.  Therapeutic Interventions: 1 on 1 counseling sessions, Psychoeducation, Medication administration, Evaluate responses to treatment, Monitor vital signs and CBGs as ordered, Perform/monitor CIWA, COWS, AIMS and Fall Risk screenings as ordered, Perform wound care treatments as ordered.  Evaluation of Outcomes: Progressing   LCSW Treatment Plan for Primary Diagnosis: Severe episode of recurrent major depressive disorder, without psychotic features  ([place]) Long Term Goal(s): Safe transition to appropriate next level of care at discharge, Engage patient in therapeutic group addressing interpersonal concerns.  Short Term Goals: Engage patient in aftercare planning with referrals and resources, Increase social support, Increase emotional regulation and Increase skills for wellness and recovery  Therapeutic Interventions: Assess for all discharge needs, 1 to 1 time with Social worker, Explore available resources and support systems, Assess for adequacy in community support network, Educate family and significant other(s) on suicide prevention, Complete Psychosocial Assessment, Interpersonal group therapy.  Evaluation of Outcomes: Progressing   Progress in Treatment: Attending groups: No. Participating in groups: No. Taking medication as prescribed: Yes. Toleration medication: Yes. Family/Significant other contact made: No, will contact:  CSW assessing  Patient understands diagnosis: Yes. Discussing patient identified problems/goals with staff: Yes. Medical problems stabilized or resolved: Yes. Denies suicidal/homicidal ideation: Yes. Issues/concerns per patient self-inventory: No.  New problem(s) identified: No, Describe:  None identified.  New Short Term/Long Term Goal(s): Patient stated goal is to get on new medications.  Discharge Plan or Barriers: CSW assessing proper aftercare plans.   Reason for Continuation of Hospitalization: Homicidal ideation  Estimated Length of Stay: 2-3 days   Attendees: Patient: Meredith Juarez  11/08/2016 10:53 AM  Physician: Dr. Orson Slick, MD  11/08/2016 10:53 AM  Nursing: Elige Radon, BSN, RN  11/08/2016 10:53 AM  RN Care Manager: 11/08/2016 10:53 AM  Social Worker: Glorious Peach, MSW, [place] 11/08/2016 10:53 AM  Recreational Therapist:  11/08/2016 10:53 AM  Other:  11/08/2016 10:53 AM  Other:  11/08/2016 10:53 AM  Other: 11/08/2016 10:53 AM    Scribe for Treatment Team: Emilie Rutter,  [place] 11/08/2016 10:53 AM
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
## 2016-11-08 NOTE — Plan of Care (Signed)
Problem: Coping: Goal: Ability to verbalize frustrations and anger appropriately will improve Outcome: Not Progressing Pt unable to verbalize feelings, could not tell me why she was feeling bad. Continues to be irritable

## 2016-11-08 NOTE — [facility] Group Notes (Signed)
[place] LCSW Group Therapy   11/08/2016 9:30 am Type of Therapy: Group Therapy   Participation Level: Patient invited but did not attend.   Glorious Peach, MSW, LCSWA 11/08/2016, 10:26AM

## 2016-11-08 NOTE — [facility] Group Notes (Signed)
[facility] Group Notes:  (Nursing/MHT/Case Management/Adjunct)  Date:  11/08/2016  Time:  4:00 PM  Type of Therapy:  Psychoeducational Skills  Participation Level:  Did Not Attend  Charise Killian 11/08/2016, 4:00 PM

## 2016-11-08 NOTE — Progress Notes (Signed)
Patient continues to be irritable. Unable to verbalize feelings, or concerns, just endorses feeling bad in all areas. Denies SI, HI, AVH. Pt is medication compliant but did not attend group. Minimal interaction with staff and peers. Eating meals well. Isolative to self and room. Encouragement and support offered. Safety checks maintained. Medications given as prescribed.  Pt receptive and remains safe on unit with q 15 min checks.

## 2016-11-09 DIAGNOSIS — Z5181 Encounter for therapeutic drug level monitoring: Secondary | ICD-10-CM

## 2016-11-09 LAB — HEMOGLOBIN A1C
Hgb A1c MFr Bld: 5.5 % (ref 4.8–5.6)
Mean Plasma Glucose: 111 mg/dL

## 2016-11-09 MED ORDER — POTASSIUM CHLORIDE CRYS ER 20 MEQ PO TBCR: 20.0000 meq | EXTENDED_RELEASE_TABLET | Freq: Every day | ORAL | 1 refills | Status: DC

## 2016-11-09 MED ORDER — CLOPIDOGREL BISULFATE 75 MG PO TABS: 75.0000 mg | ORAL_TABLET | Freq: Every day | ORAL | 1 refills | Status: DC

## 2016-11-09 MED ORDER — AMLODIPINE BESYLATE 10 MG PO TABS: 10.0000 mg | ORAL_TABLET | Freq: Every day | ORAL | 1 refills | Status: DC

## 2016-11-09 MED ORDER — TRAZODONE HCL 100 MG PO TABS
100.0000 mg | ORAL_TABLET | Freq: Every day | ORAL | Status: DC
  Filled 2016-11-09: qty 1

## 2016-11-09 MED ORDER — QUETIAPINE FUMARATE 200 MG PO TABS: 200.0000 mg | ORAL_TABLET | Freq: Every day | ORAL | 1 refills | Status: DC

## 2016-11-09 MED ORDER — METOPROLOL TARTRATE 50 MG PO TABS: 50.0000 mg | ORAL_TABLET | Freq: Every day | ORAL | 1 refills | Status: DC

## 2016-11-09 MED ORDER — ASPIRIN EC 81 MG PO TBEC: 81.0000 mg | DELAYED_RELEASE_TABLET | Freq: Every day | ORAL | 1 refills | Status: DC

## 2016-11-09 MED ORDER — SERTRALINE HCL 100 MG PO TABS: 100.0000 mg | ORAL_TABLET | Freq: Every day | ORAL | 1 refills | Status: AC

## 2016-11-09 MED ORDER — QUETIAPINE FUMARATE 200 MG PO TABS
200.0000 mg | ORAL_TABLET | Freq: Every day | ORAL | Status: DC
  Administered 2016-11-09: 200 mg via ORAL
  Filled 2016-11-09: qty 1

## 2016-11-09 MED ORDER — HYDROCHLOROTHIAZIDE 25 MG PO TABS: 25.0000 mg | ORAL_TABLET | Freq: Every day | ORAL | 1 refills | Status: DC

## 2016-11-09 MED ORDER — SERTRALINE HCL 100 MG PO TABS: 100.0000 mg | ORAL_TABLET | Freq: Every day | ORAL | 1 refills | Status: DC

## 2016-11-09 MED ORDER — TRAZODONE HCL 100 MG PO TABS: 100.0000 mg | ORAL_TABLET | Freq: Every day | ORAL | 1 refills | Status: DC

## 2016-11-09 MED ORDER — NITROGLYCERIN 0.4 MG SL SUBL: 0.4000 mg | SUBLINGUAL_TABLET | SUBLINGUAL | 0 refills | Status: DC | PRN

## 2016-11-09 MED ORDER — TRAZODONE HCL 100 MG PO TABS: 100.0000 mg | ORAL_TABLET | Freq: Every day | ORAL | 1 refills | Status: AC

## 2016-11-09 MED ORDER — QUETIAPINE FUMARATE 200 MG PO TABS
200.0000 mg | ORAL_TABLET | Freq: Every day | ORAL | 1 refills | Status: AC
Start: 2016-11-09 — End: ?

## 2016-11-09 NOTE — [facility] Group Notes (Signed)
[facility] Group Notes:  (Nursing/MHT/Case Management/Adjunct)  Date:  11/09/2016  Time:  2:47 PM  Type of Therapy:  Psychoeducational Skills  Participation Level:  Did Not Attend  Charise Killian 11/09/2016, 2:47 PM

## 2016-11-09 NOTE — Progress Notes (Signed)
Recreation Therapy Notes  Date: 05.15.18 Time: 3:00 pm Location: Craft Room  Group Topic: Self-expression  Goal Area(s) Addresses:  Patient will be able to identify a color that represents each emotion. Patient will verbalize benefit of using art as a means of self-expression. Patient will verbalize one emotion experienced while participating in activity.  Behavioral Response: Did not attend  Intervention: The Colors Within Me  Activity: Patients were given blank face worksheets and were instructed pick a color for each emotion they were feeling and show on the worksheet how much of that emotion they were feeling.  Education: LRT educated patients on other forms of self-expression.  Education Outcome: Patient did not attend group.  Clinical Observations/Feedback: Patient did not attend group.  Leonette Monarch, LRT/CTRS 11/09/2016 3:50 PM

## 2016-11-09 NOTE — Progress Notes (Signed)
ekg completed

## 2016-11-09 NOTE — Progress Notes (Signed)
Patient ID: Meredith Juarez, female   DOB: 1960/10/24, 56 y.o.   MRN: [id number] A&Ox3, denied pain, denied SI/HI/AVH, medication compliant.

## 2016-11-09 NOTE — Progress Notes (Signed)
Remains isolative to room. Comes out for meals and meds. Does not attend groups. Denies SI, HI, AVH. Medication compliant. Continues to be irritable. Encouragement and support offered. Safety checks maintained. Pt receptive and remains safe on unit with q 15 min checks.

## 2016-11-09 NOTE — Progress Notes (Signed)
Recreation Therapy Notes  INPATIENT RECREATION THERAPY ASSESSMENT  Patient Details Name: Kataleya Zaugg MRN: [id number] DOB: 08-27-1960 Today's Date: 2016/11/10  Patient Stressors: Death, Other (Comment) (Family friend died; being in the hospital; MDD)  Coping Skills:   Isolate, Substance Abuse, Arguments, Exercise, Music  Personal Challenges: Anger, Concentration, Decision-Making, Problem-Solving, Relationships, Self-Esteem/Confidence, Social Interaction, Stress Management, Time Management, Trusting Others  Leisure Interests (2+):   (Nothing)  Awareness of Community Resources:  Yes  Community Resources:   (Patient reported she was not interested in going)  Current Use:    If no, Barriers?:    Patient Strengths:  Nothing  Patient Identified Areas of Improvement:  "My whole way of being"  Current Recreation Participation:  Nothing  Patient Goal for Hospitalization:  To get out of here as soon as possible  [place] of Residence:  [place] of Residence:  [place]   Current SI (including self-harm):  Yes  Current HI:  Yes  Consent to Intern Participation: N/A  LRT told patient goals LRT could help patient with. Patient reported she wanted to wait to see how she was feeling tomorrow. Due to patient wanting to wait, LRT will not develop a Recreational Therapy Care Plan. LRT will follow up with patient tomorrow.  Leonette Monarch, LRT/CTRS 11-10-16, 4:05 PM

## 2016-11-09 NOTE — Plan of Care (Signed)
Problem: Activity: Goal: Sleeping patterns will improve Outcome: Progressing Patient slept for Estimated Hours of 8; every 15 minutes safety round maintained, no injury or falls during this shift.    

## 2016-11-09 NOTE — [facility] Group Notes (Signed)
[place] LCSW Group Therapy Note  Date/Time: 11/09/16, 0930  Type of Therapy/Topic:  Group Therapy:  Feelings about Diagnosis  Participation Level:  Did Not Attend   Mood:   Description of Group:    This group will allow patients to explore their thoughts and feelings about diagnoses they have received. Patients will be guided to explore their level of understanding and acceptance of these diagnoses. Facilitator will encourage patients to process their thoughts and feelings about the reactions of others to their diagnosis, and will guide patients in identifying ways to discuss their diagnosis with significant others in their lives. This group will be process-oriented, with patients participating in exploration of their own experiences as well as giving and receiving support and challenge from other group members.   Therapeutic Goals: 1. Patient will demonstrate understanding of diagnosis as evidence by identifying two or more symptoms of the disorder:  2. Patient will be able to express two feelings regarding the diagnosis 3. Patient will demonstrate ability to communicate their needs through discussion and/or role plays  Summary of Patient Progress:        Therapeutic Modalities:   Cognitive Behavioral Therapy Brief Therapy Feelings Identification   Lurline Idol, LCSW
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
## 2016-11-09 NOTE — Plan of Care (Signed)
Problem: Activity: Goal: Interest or engagement in activities will improve Outcome: Not Progressing Patient continues to be isolative to self and room. Pt has to be encouraged to come for meals

## 2016-11-10 NOTE — Progress Notes (Signed)
  [facility] Adult Case Management Discharge Plan :  Will you be returning to the same living situation after discharge:  Yes,  home  At discharge, do you have transportation home?: Yes,  bus  Do you have the ability to pay for your medications: Yes,  [facility], [place]   Release of information consent forms completed and in the chart;  Patient's signature needed at discharge.  Patient to Follow up at: Follow-up Information    [place] on 11/12/2016.   Why:  Hospital discharge appointment is Friday, May 18th at 8:00am.  Contact information: [address] [place] [address] [phone]           Next level of care provider has access to Ali Molina and Suicide Prevention discussed: Yes,  with patient   Have you used any form of tobacco in the last 30 days? (Cigarettes, Smokeless Tobacco, Cigars, and/or Pipes): Yes  Has patient been referred to the Quitline?: Patient refused referral  Patient has been referred for addiction treatment: Yes  Wray Kearns MSW, [place]  11/10/2016, 11:18 AM
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
## 2016-11-10 NOTE — BHH Group Notes (Signed)
[place] LCSW Group Therapy  11/10/2016 10:23 AM  Type of Therapy:  Group Therapy  Participation Level:  Did Not Attend  Modes of Intervention:  Activity, Discussion, Education, Socialization and Support  Summary of Progress/Problems: Balance in life: Patients will discuss the concept of balance and how it looks and feels to be unbalanced. Pt will identify areas in their life that is unbalanced and ways to become more balanced.   [place] MSW, [place]  11/10/2016, 10:23 AM

## 2016-11-10 NOTE — Progress Notes (Signed)
D: Patient is aware of  Discharge this shift .Patient denies suicidal /homicidal ideations. Patient received all belongings brought in  A: No Storage medications. Writer reviewed Discharge Summary, Suicide Risk Assessment, and Transitional Record. Patient also received Prescriptions   from  MD. A 7 day supply of medications given to patient A: Writer instructed on discharge criteria  . Aware  Of follow up appointment . R: Patient left unit with no questions  Or concerns  [employer] home

## 2016-11-10 NOTE — BHH Suicide Risk Assessment (Signed)
[facility] Discharge Suicide Risk Assessment   Principal Problem: Severe episode of recurrent major depressive disorder, without psychotic features [facility]) Discharge Diagnoses:  Patient Active Problem List   Diagnosis Date Noted  . GERD (gastroesophageal reflux disease) [K21.9] 11/07/2016  . Tobacco use disorder [F17.200] 11/07/2016  . Alcohol use disorder, severe, dependence ([place]) [F10.20] 11/07/2016  . Severe episode of recurrent major depressive disorder, without psychotic features ([place]) [F33.2]   . Migraines [G43.909] 09/05/2014  . Hypertension [I10] 01/01/2014    Total Time spent with patient: 30 minutes  Musculoskeletal: Strength & Muscle Tone: within normal limits Gait & Station: normal Patient leans: N/A  Psychiatric Specialty Exam: Review of Systems  Psychiatric/Behavioral: Positive for substance abuse.  All other systems reviewed and are negative.   Blood pressure 117/85, pulse 93, temperature 98 F (36.7 C), temperature source Oral, resp. rate 18, height 5\' 5"  (1.651 m), weight 80.3 kg (177 lb), SpO2 100 %.Body mass index is 29.45 kg/m.  General Appearance: Casual  Eye Contact::  Good  Speech:  Clear and Coherent409  Volume:  Normal  Mood:  Dysphoric  Affect:  Congruent  Thought Process:  Goal Directed and Descriptions of Associations: Intact  Orientation:  Full (Time, Place, and Person)  Thought Content:  WDL  Suicidal Thoughts:  No  Homicidal Thoughts:  No  Memory:  Immediate;   Fair Recent;   Fair Remote;   Fair  Judgement:  Impaired  Insight:  Lacking  Psychomotor Activity:  Normal  Concentration:  Fair  Recall:  [employer] of [place]  Language: Fair  Akathisia:  No  Handed:  Right  AIMS (if indicated):     Assets:  Communication Skills Desire for Improvement Housing Physical Health Resilience Social Support  Sleep:  Number of Hours: 7.15  Cognition: WNL  ADL's:  Intact   Mental Status Per Nursing Assessment::   On Admission:   NA  Demographic Factors:  Low socioeconomic status and Unemployed  Loss Factors: Financial problems/change in socioeconomic status  Historical Factors: Prior suicide attempts, Family history of mental illness or substance abuse and Impulsivity  Risk Reduction Factors:   Sense of responsibility to family, Living with another person, especially a relative and Positive social support  Continued Clinical Symptoms:  Depression:   Comorbid alcohol abuse/dependence Impulsivity Alcohol/Substance Abuse/Dependencies Medical Diagnoses and Treatments/Surgeries  Cognitive Features That Contribute To Risk:  None    Suicide Risk:  Minimal: No identifiable suicidal ideation.  Patients presenting with no risk factors but with morbid ruminations; may be classified as minimal risk based on the severity of the depressive symptoms  Follow-up Information    [place] to.   Contact information: [place] [address] [phone]           Plan Of Care/Follow-up recommendations:  Activity:  as tolerated. Diet:  low sodium heart healthy. Other:  keep follow up appointments.  Orson Slick, MD 11/10/2016, 10:25 AM

## 2016-11-10 NOTE — Progress Notes (Signed)
Recreation Therapy Notes  At approximately 11:10 am, LRT asked to see if patient wanted to work on her goals. Patient reported she felt good and did not need help at this time.  Leonette Monarch, LRT/CTRS 11/10/2016 2:20 PM

## 2016-11-10 NOTE — BHH Suicide Risk Assessment (Signed)
[place] INPATIENT:  Family/Significant Other Suicide Prevention Education  Suicide Prevention Education:  Patient Refusal for Family/Significant Other Suicide Prevention Education: The patient Meredith Juarez has refused to provide written consent for family/significant other to be provided Family/Significant Other Suicide Prevention Education during admission and/or prior to discharge.  Physician notified.  [place] MSW, [place]  11/10/2016, 11:10 AM

## 2016-11-10 NOTE — Progress Notes (Signed)
Patient ID: Meredith Juarez, female   DOB: 1961-03-16, 56 y.o.   MRN: [id number] Remains mostly to room, mood and affect flat, blunted; evasive, apathetic; Seroquel 200 mg given but refused Trazodone 100 mg as scheduled at bedtime, "that medication gives me nightmares, I am not taking it .Marland Kitchen.." She is not aware of possible discharge on the 5/16.

## 2016-11-10 NOTE — Plan of Care (Signed)
Problem: Activity: Goal: Sleeping patterns will improve Outcome: Progressing Patient slept for Estimated Hours of 7.15; every 15 minutes safety round maintained, no injury or falls during this shift.

## 2016-11-10 NOTE — Tx Team (Signed)
Interdisciplinary Treatment and Diagnostic Plan Update  11/10/2016 Time of Session: 9:00 am Lyzbeth Genrich MRN: [id number]  Principal Diagnosis: Severe episode of recurrent major depressive disorder, without psychotic features ([place])  Secondary Diagnoses: Principal Problem:   Severe episode of recurrent major depressive disorder, without psychotic features ([place]) Active Problems:   Migraines   Hypertension   GERD (gastroesophageal reflux disease)   Tobacco use disorder   Alcohol use disorder, severe, dependence ([place])   Current Medications:  Current Facility-Administered Medications  Medication Dose Route Frequency Provider Last Rate Last Dose  . acetaminophen (TYLENOL) tablet 650 mg  650 mg Oral Q6H PRN Lenward Chancellor, MD      . alum & mag hydroxide-simeth (MAALOX/MYLANTA) 200-200-20 MG/5ML suspension 20 mL  20 mL Oral Q4H PRN Lenward Chancellor, MD      . amLODipine (NORVASC) tablet 10 mg  10 mg Oral Daily Hildred Priest, MD   10 mg at 11/10/16 0835  . aspirin EC tablet 81 mg  81 mg Oral Daily Lenward Chancellor, MD   81 mg at 11/10/16 0837  . clopidogrel (PLAVIX) tablet 75 mg  75 mg Oral Daily Lenward Chancellor, MD   75 mg at 11/10/16 0836  . famotidine (PEPCID) tablet 20 mg  20 mg Oral BID Lenward Chancellor, MD   20 mg at 11/10/16 0835  . hydrochlorothiazide (HYDRODIURIL) tablet 25 mg  25 mg Oral Daily Lenward Chancellor, MD   25 mg at 11/10/16 0835  . magnesium hydroxide (MILK OF MAGNESIA) suspension 30 mL  30 mL Oral Daily PRN Lenward Chancellor, MD      . metoprolol tartrate (LOPRESSOR) tablet 25 mg  25 mg Oral BID Hildred Priest, MD   25 mg at 11/10/16 0836  . nicotine (NICODERM CQ - dosed in mg/24 hours) patch 14 mg  14 mg Transdermal Daily Pucilowska, Jolanta B, MD   14 mg at 11/09/16 0757  . nitroGLYCERIN (NITROSTAT) SL tablet 0.4 mg  0.4 mg Sublingual Q5 min PRN Lenward Chancellor, MD      . potassium chloride SA (K-DUR,KLOR-CON) CR tablet 20 mEq  20 mEq  Oral Daily Lenward Chancellor, MD   20 mEq at 11/10/16 0835  . QUEtiapine (SEROQUEL) tablet 200 mg  200 mg Oral QHS Pucilowska, Jolanta B, MD   200 mg at 11/09/16 2113  . sertraline (ZOLOFT) tablet 100 mg  100 mg Oral Daily Lenward Chancellor, MD   100 mg at 11/10/16 0835  . traZODone (DESYREL) tablet 100 mg  100 mg Oral QHS Pucilowska, Jolanta B, MD       PTA Medications: Prescriptions Prior to Admission  Medication Sig Dispense Refill Last Dose  . acetaminophen (TYLENOL) 500 MG tablet Take 500 mg by mouth every 4 (four) hours as needed.   prn at prn  . QUEtiapine (SEROQUEL) 100 MG tablet Take 100 mg by mouth at bedtime.   unknown at unknown  . [DISCONTINUED] NITROSTAT 0.4 MG SL tablet DISSOLVE 1 TABLET UNDER TONGUE EVERY 5 MINUTES FOR CHEST PAIN. MAX 3 DOSES. IF NO RELIEF AFTER 1st DOSE, CALL 911. 25 tablet 0 prn at prn  . [DISCONTINUED] sertraline (ZOLOFT) 100 MG tablet Take 100 mg by mouth daily.   unknown at unknown  . amLODipine (NORVASC) 5 MG tablet TAKE ONE TABLET BY MOUTH EVERY DAY (Patient not taking: Reported on 11/09/2016) 90 tablet 0 Not Taking at Unknown time  . metoprolol tartrate (LOPRESSOR) 25 MG tablet TAKE ONE TABLET BY MOUTH EVERY DAY. (Patient not taking: Reported on 11/09/2016) 90 tablet  0 Not Taking at Unknown time  . nortriptyline (PAMELOR) 25 MG capsule Take 25 mg by mouth daily.   Not Taking at Unknown time  . potassium chloride (K-DUR) 10 MEQ tablet Take 1 tablet (10 mEq total) by mouth daily. (Patient not taking: Reported on 11/09/2016) 5 tablet 0 Not Taking at Unknown time  . ranitidine (ZANTAC) 150 MG tablet TAKE ONE TABLET BY MOUTH 2 TIMES A DAY. (Patient not taking: Reported on 11/09/2016) 180 tablet 0 Not Taking at Unknown time  . [DISCONTINUED] aspirin EC 81 MG tablet Take 81 mg by mouth daily.   Taking  . [DISCONTINUED] clopidogrel (PLAVIX) 75 MG tablet TAKE ONE TABLET BY MOUTH EVERY DAY. (Patient not taking: Reported on 11/09/2016) 90 tablet 0 Not Taking at Unknown time   . [DISCONTINUED] hydrochlorothiazide (HYDRODIURIL) 25 MG tablet TAKE ONE TABLET BY MOUTH EVERY DAY. (Patient not taking: Reported on 11/09/2016) 90 tablet 0 Not Taking at Unknown time  . [DISCONTINUED] metoprolol (LOPRESSOR) 50 MG tablet Take 1 tablet (50 mg total) by mouth daily. (Patient not taking: Reported on 11/09/2016) 30 tablet 0 Not Taking at Unknown time  . [DISCONTINUED] potassium chloride SA (K-DUR,KLOR-CON) 20 MEQ tablet TAKE 1 TABLET BY MOUTH EVERY DAY (Patient not taking: Reported on 11/09/2016) 90 tablet 0 Not Taking at Unknown time  . [DISCONTINUED] traZODone (DESYREL) 100 MG tablet Take 100 mg by mouth at bedtime. 1/2 - 1 tablet as needed   Not Taking at Unknown time    Patient Stressors: Financial difficulties Substance abuse Traumatic event  Patient Strengths: Average or above average intelligence Communication skills Physical Health  Treatment Modalities: Medication Management, Group therapy, Case management,  1 to 1 session with clinician, Psychoeducation, Recreational therapy.   Physician Treatment Plan for Primary Diagnosis: Severe episode of recurrent major depressive disorder, without psychotic features ([place]) Long Term Goal(s): Improvement in symptoms so as ready for discharge Improvement in symptoms so as ready for discharge   Short Term Goals: Ability to identify changes in lifestyle to reduce recurrence of condition will improve Compliance with prescribed medications will improve Ability to disclose and discuss suicidal ideas Ability to demonstrate self-control will improve Ability to identify and develop effective coping behaviors will improve Ability to identify triggers associated with substance abuse/mental health issues will improve  Medication Management: Evaluate patient's response, side effects, and tolerance of medication regimen.  Therapeutic Interventions: 1 to 1 sessions, Unit Group sessions and Medication administration.  Evaluation of  Outcomes: Adequate for Discharge  Physician Treatment Plan for Secondary Diagnosis: Principal Problem:   Severe episode of recurrent major depressive disorder, without psychotic features ([place]) Active Problems:   Migraines   Hypertension   GERD (gastroesophageal reflux disease)   Tobacco use disorder   Alcohol use disorder, severe, dependence ([place])  Long Term Goal(s): Improvement in symptoms so as ready for discharge Improvement in symptoms so as ready for discharge   Short Term Goals: Ability to identify changes in lifestyle to reduce recurrence of condition will improve Compliance with prescribed medications will improve Ability to disclose and discuss suicidal ideas Ability to demonstrate self-control will improve Ability to identify and develop effective coping behaviors will improve Ability to identify triggers associated with substance abuse/mental health issues will improve     Medication Management: Evaluate patient's response, side effects, and tolerance of medication regimen.  Therapeutic Interventions: 1 to 1 sessions, Unit Group sessions and Medication administration.  Evaluation of Outcomes: Adequate for Discharge   RN Treatment Plan for Primary Diagnosis: Severe episode  of recurrent major depressive disorder, without psychotic features ([place]) Long Term Goal(s): Knowledge of disease and therapeutic regimen to maintain health will improve  Short Term Goals: Ability to verbalize feelings will improve, Ability to identify and develop effective coping behaviors will improve and Compliance with prescribed medications will improve  Medication Management: RN will administer medications as ordered by provider, will assess and evaluate patient's response and provide education to patient for prescribed medication. RN will report any adverse and/or side effects to prescribing provider.  Therapeutic Interventions: 1 on 1 counseling sessions, Psychoeducation, Medication  administration, Evaluate responses to treatment, Monitor vital signs and CBGs as ordered, Perform/monitor CIWA, COWS, AIMS and Fall Risk screenings as ordered, Perform wound care treatments as ordered.  Evaluation of Outcomes: Adequate for Discharge   LCSW Treatment Plan for Primary Diagnosis: Severe episode of recurrent major depressive disorder, without psychotic features ([place]) Long Term Goal(s): Safe transition to appropriate next level of care at discharge, Engage patient in therapeutic group addressing interpersonal concerns.  Short Term Goals: Engage patient in aftercare planning with referrals and resources, Increase social support, Increase ability to appropriately verbalize feelings, Increase emotional regulation, Identify triggers associated with mental health/substance abuse issues and Increase skills for wellness and recovery  Therapeutic Interventions: Assess for all discharge needs, 1 to 1 time with Social worker, Explore available resources and support systems, Assess for adequacy in community support network, Educate family and significant other(s) on suicide prevention, Complete Psychosocial Assessment, Interpersonal group therapy.  Evaluation of Outcomes: Adequate for Discharge   Progress in Treatment: Attending groups: Yes. Participating in groups: Yes. Taking medication as prescribed: Yes. Toleration medication: Yes. Family/Significant other contact made: No, will contact:  pt refused  Patient understands diagnosis: Yes. Discussing patient identified problems/goals with staff: Yes. Medical problems stabilized or resolved: Yes. Denies suicidal/homicidal ideation: Yes. Issues/concerns per patient self-inventory: No. Other: NA   New problem(s) identified: Yes, Describe:  NA  New Short Term/Long Term Goal(s):  Discharge Plan or Barriers: Pt plans to return home and follow up with outpatient.    Reason for Continuation of Hospitalization: Homicidal  ideation Medication stabilization  Estimated Length of Stay: d/c 5/16  Attendees: Patient: 11/10/2016 11:14 AM  Physician:  Dr. Bary Leriche  11/10/2016 11:14 AM  Nursing: Meredith Mody, RN  11/10/2016 11:14 AM  RN Care Manager: 11/10/2016 11:14 AM  Social Worker: [place], [place] 11/10/2016 11:14 AM  Recreational Therapist: Everitt Amber, LRT  11/10/2016 11:14 AM  Other:  11/10/2016 11:14 AM  Other:  11/10/2016 11:14 AM  Other: 11/10/2016 11:14 AM    Scribe for Treatment Team: Wray Kearns, LCSWA 11/10/2016 11:14 AM

## 2016-11-11 ENCOUNTER — Emergency Department
Admission: EM | Admit: 2016-11-11 | Discharge: 2016-11-11 | Disposition: A | Discharge status: Home / Self Care | Payer: Self-pay | Attending: Emergency Medicine | Admitting: Emergency Medicine

## 2016-11-11 ENCOUNTER — Emergency Department: Admission: EM | Admit: 2016-11-11 | Discharge: 2016-11-11 | Discharge status: Against Medical Advice | Payer: Self-pay

## 2016-11-11 ENCOUNTER — Emergency Department: Payer: Self-pay

## 2016-11-11 ENCOUNTER — Encounter: Payer: Self-pay | Admitting: Emergency Medicine

## 2016-11-11 DIAGNOSIS — I10 Essential (primary) hypertension: Secondary | ICD-10-CM | POA: Insufficient documentation

## 2016-11-11 DIAGNOSIS — X501XXA Overexertion from prolonged static or awkward postures, initial encounter: Secondary | ICD-10-CM | POA: Insufficient documentation

## 2016-11-11 DIAGNOSIS — Y929 Unspecified place or not applicable: Secondary | ICD-10-CM | POA: Insufficient documentation

## 2016-11-11 DIAGNOSIS — S93602A Unspecified sprain of left foot, initial encounter: Secondary | ICD-10-CM | POA: Insufficient documentation

## 2016-11-11 DIAGNOSIS — Y999 Unspecified external cause status: Secondary | ICD-10-CM | POA: Insufficient documentation

## 2016-11-11 DIAGNOSIS — F17210 Nicotine dependence, cigarettes, uncomplicated: Secondary | ICD-10-CM | POA: Insufficient documentation

## 2016-11-11 DIAGNOSIS — Z79899 Other long term (current) drug therapy: Secondary | ICD-10-CM | POA: Insufficient documentation

## 2016-11-11 DIAGNOSIS — Z7982 Long term (current) use of aspirin: Secondary | ICD-10-CM | POA: Insufficient documentation

## 2016-11-11 DIAGNOSIS — Y939 Activity, unspecified: Secondary | ICD-10-CM | POA: Insufficient documentation

## 2016-11-11 MED ORDER — IBUPROFEN 600 MG PO TABS: 600.0000 mg | ORAL_TABLET | Freq: Three times a day (TID) | ORAL | 0 refills | Status: DC | PRN

## 2016-11-11 MED ORDER — IBUPROFEN 400 MG PO TABS
600.0000 mg | ORAL_TABLET | Freq: Once | ORAL | Status: AC
  Administered 2016-11-11: 600 mg via ORAL
  Filled 2016-11-11: qty 2

## 2016-11-11 NOTE — ED Notes (Signed)
Pt to ed via ems with left foot pain and hallucinations. VSS, CBG 123. NAD.

## 2016-11-11 NOTE — ED Provider Notes (Signed)
[facility] Emergency Department Provider Note  ____________________________________________   First MD Initiated Contact with Patient 11/11/16 1930     (approximate)  I have reviewed the triage vital signs and the nursing notes.   HISTORY  Chief Complaint Foot Pain and Depression    HPI Meredith Juarez is a 56 y.o. female who comes to the emergency department with 2 days of moderate severity aching left midfoot pain that happened after she rolled her ankle. She has been able to ambulate on it. She has no numbness or weakness. She did not fall or hit her head. She is recently released from a psychiatric facility and she reports that she still feels somewhat depressed but improved from before and is not suicidal.   Past Medical History:  Diagnosis Date  . Anemia   . Depression   . Diverticulitis   . GERD (gastroesophageal reflux disease)   . Hypertension   . Migraines   . Stroke ([place])   . Thyroid disease     Patient Active Problem List   Diagnosis Date Noted  . GERD (gastroesophageal reflux disease) 11/07/2016  . Tobacco use disorder 11/07/2016  . Alcohol use disorder, severe, dependence ([place]) 11/07/2016  . Severe episode of recurrent major depressive disorder, without psychotic features ([place])   . Migraines 09/05/2014  . Hypertension 01/01/2014    History reviewed. No pertinent surgical history.  Prior to Admission medications   Medication Sig Start Date End Date Taking? Authorizing Provider  acetaminophen (TYLENOL) 500 MG tablet Take 500 mg by mouth every 4 (four) hours as needed.    [provider]  amLODipine (NORVASC) 10 MG tablet Take 1 tablet (10 mg total) by mouth daily. 11/10/16   Pucilowska, Herma Ard B, MD  aspirin EC 81 MG tablet Take 1 tablet (81 mg total) by mouth daily. 11/09/16   Pucilowska, Herma Ard B, MD  clopidogrel (PLAVIX) 75 MG tablet Take 1 tablet (75 mg total) by mouth daily. 11/09/16   Pucilowska, Jolanta B, MD    hydrochlorothiazide (HYDRODIURIL) 25 MG tablet Take 1 tablet (25 mg total) by mouth daily. 11/09/16   Pucilowska, Herma Ard B, MD  ibuprofen (ADVIL,MOTRIN) 600 MG tablet Take 1 tablet (600 mg total) by mouth every 8 (eight) hours as needed. 11/11/16   Darel Hong, MD  metoprolol tartrate (LOPRESSOR) 50 MG tablet Take 1 tablet (50 mg total) by mouth daily. 11/09/16 11/09/17  Pucilowska, Wardell Honour, MD  nitroGLYCERIN (NITROSTAT) 0.4 MG SL tablet Place 1 tablet (0.4 mg total) under the tongue every 5 (five) minutes as needed for chest pain. 11/09/16   Pucilowska, Jolanta B, MD  potassium chloride SA (K-DUR,KLOR-CON) 20 MEQ tablet Take 1 tablet (20 mEq total) by mouth daily. 11/09/16   Pucilowska, Herma Ard B, MD  QUEtiapine (SEROQUEL) 200 MG tablet Take 1 tablet (200 mg total) by mouth at bedtime. 11/09/16   Pucilowska, Herma Ard B, MD  sertraline (ZOLOFT) 100 MG tablet Take 1 tablet (100 mg total) by mouth daily. 11/09/16   Pucilowska, Herma Ard B, MD  traZODone (DESYREL) 100 MG tablet Take 1 tablet (100 mg total) by mouth at bedtime. 1/2 - 1 tablet as needed 11/09/16   Pucilowska, Wardell Honour, MD    Allergies Patient has no known allergies.  Family History  Problem Relation Age of Onset  . Cancer Mother        Uterine, Breast, Ovarian  . Hypertension Mother   . Hypertension Father   . Bipolar disorder Father   . CAD Sister   .  Hyperlipidemia Sister     Social History Social History  Substance Use Topics  . Smoking status: Current Every Day Smoker    Packs/day: 0.25    Types: Cigarettes  . Smokeless tobacco: Never Used  . Alcohol use No    Review of Systems Constitutional: No fever/chills ENT: No sore throat. Cardiovascular: Denies chest pain. Respiratory: Denies shortness of breath. Gastrointestinal: No abdominal pain.  No nausea, no vomiting.  No diarrhea.  No constipation. Musculoskeletal: Negative for back pain. Neurological: Negative for  headaches   ____________________________________________   PHYSICAL EXAM:  VITAL SIGNS: ED Triage Vitals  Enc Vitals Group     BP 11/11/16 1729 128/86     Pulse Rate 11/11/16 1729 80     Resp 11/11/16 1729 18     Temp 11/11/16 1729 98 F (36.7 C)     Temp Source 11/11/16 1729 Oral     SpO2 11/11/16 1729 97 %     Weight 11/11/16 1729 177 lb (80.3 kg)     Height 11/11/16 1729 5\' 6"  (1.676 m)     Head Circumference --      Peak Flow --      Pain Score 11/11/16 1728 8     Pain Loc --      Pain Edu? --      Excl. in [place]? --     Constitutional: Alert and oriented x 4 well appearing nontoxic no diaphoresis speaks in full, clear sentences Head: Atraumatic. Nose: No congestion/rhinnorhea. Mouth/Throat: No trismus Neck: No stridor.   Cardiovascular: Regular rate and rhythm Respiratory: Normal respiratory effort.  No retractions. Gastrointestinal: Soft nontender Musculoskeletal: No tenderness over the metatarsal or navicular or tender over mid foot with no swelling. Neurovascularly intact skin closed compartment soft no tenderness over medial or lateral malleolus or for 6 cm proximal Neurologic:  Normal speech and language. No gross focal neurologic deficits are appreciated.  Skin:  Skin is warm, dry and intact. No rash noted.    ____________________________________________  LABS (all labs ordered are listed, but only abnormal results are displayed)  Labs Reviewed - No data to display   __________________________________________  EKG   ____________________________________________  RADIOLOGY  X-ray of the foot shows no fracture dislocation ____________________________________________   PROCEDURES  Procedure(s) performed: no  Procedures  Critical Care performed: no  Observation: no ____________________________________________   INITIAL IMPRESSION / ASSESSMENT AND PLAN / ED COURSE  Pertinent labs & imaging results that were available during my care of the  patient were reviewed by me and considered in my medical decision making (see chart for details).  Fortunately the patient's x-ray of her foot is negative. She has no indication for ankle x-rays as she has no tenderness of the medial or lateral malleolus or for 6 cm proximal and she is neurovascularly intact. She is placed in an Ace wrap and given NSAIDs with significant relief. No indication for involuntary commitment for psychiatric consultation this time. She is discharged in improved condition.      ____________________________________________   FINAL CLINICAL IMPRESSION(S) / ED DIAGNOSES  Final diagnoses:  Sprain of left foot, initial encounter      NEW MEDICATIONS STARTED DURING THIS VISIT:  Discharge Medication List as of 11/11/2016  7:39 PM    START taking these medications   Details  ibuprofen (ADVIL,MOTRIN) 600 MG tablet Take 1 tablet (600 mg total) by mouth every 8 (eight) hours as needed., Starting Thu 11/11/2016, Print         Note:  This document was prepared using Dragon voice recognition software and may include unintentional dictation errors.      Darel Hong, MD 11/12/16 1059

## 2016-11-11 NOTE — ED Notes (Signed)
Ace wrap applied and pt ambulated to lobby

## 2016-11-11 NOTE — ED Triage Notes (Signed)
Pt reports was discharged from here yesterday from psychiatric services. Pt reports left foot pain today. Pt states she has been unable to walk on it today. Pt also reports depression. States that she left her medication on the table at home and has not taken it today.

## 2016-11-11 NOTE — Discharge Instructions (Signed)
Please take ibuprofen 3 times a day as needed for pain and establish care with the primary care physician within the next week or 2 for recheck. Return to the emergency department for any concerns.  It was a pleasure to take care of you today, and thank you for coming to our emergency department.  If you have any questions or concerns before leaving please ask the nurse to grab me and I'm more than happy to go through your aftercare instructions again.  If you were prescribed any opioid pain medication today such as Norco, Vicodin, Percocet, morphine, hydrocodone, or oxycodone please make sure you do not drive when you are taking this medication as it can alter your ability to drive safely.  If you have any concerns once you are home that you are not improving or are in fact getting worse before you can make it to your follow-up appointment, please do not hesitate to call 911 and come back for further evaluation.  Darel Hong MD  Results for orders placed or performed during the hospital encounter of 11/05/16  Comprehensive metabolic panel  Result Value Ref Range   Sodium 142 135 - 145 mmol/L   Potassium 3.0 (L) 3.5 - 5.1 mmol/L   Chloride 107 101 - 111 mmol/L   CO2 23 22 - 32 mmol/L   Glucose, Bld 107 (H) 65 - 99 mg/dL   BUN 9 6 - 20 mg/dL   Creatinine, Ser 0.72 0.44 - 1.00 mg/dL   Calcium 9.4 8.9 - 10.3 mg/dL   Total Protein 8.0 6.5 - 8.1 g/dL   Albumin 4.5 3.5 - 5.0 g/dL   AST 21 15 - 41 U/L   ALT 20 14 - 54 U/L   Alkaline Phosphatase 77 38 - 126 U/L   Total Bilirubin 0.6 0.3 - 1.2 mg/dL   GFR calc non Af Amer >60 >60 mL/min   GFR calc Af Amer >60 >60 mL/min   Anion gap 12 5 - 15  Ethanol  Result Value Ref Range   Alcohol, Ethyl (B) 245 (H) <5 mg/dL  Salicylate level  Result Value Ref Range   Salicylate Lvl <2.9 2.8 - 30.0 mg/dL  Acetaminophen level  Result Value Ref Range   Acetaminophen (Tylenol), Serum <10 (L) 10 - 30 ug/mL  cbc  Result Value Ref Range   WBC 6.7 3.6  - 11.0 K/uL   RBC 5.63 (H) 3.80 - 5.20 MIL/uL   Hemoglobin 15.9 12.0 - 16.0 g/dL   HCT 46.8 35.0 - 47.0 %   MCV 83.2 80.0 - 100.0 fL   MCH 28.2 26.0 - 34.0 pg   MCHC 33.8 32.0 - 36.0 g/dL   RDW 15.1 (H) 11.5 - 14.5 %   Platelets 321 150 - 440 K/uL  Urine Drug Screen, Qualitative  Result Value Ref Range   Tricyclic, Ur Screen NONE DETECTED NONE DETECTED   Amphetamines, Ur Screen NONE DETECTED NONE DETECTED   MDMA (Ecstasy)Ur Screen NONE DETECTED NONE DETECTED   Cocaine Metabolite,Ur [place] NONE DETECTED NONE DETECTED   Opiate, Ur Screen NONE DETECTED NONE DETECTED   Phencyclidine (PCP) Ur S NONE DETECTED NONE DETECTED   Cannabinoid 50 Ng, Ur [place] NONE DETECTED NONE DETECTED   Barbiturates, Ur Screen NONE DETECTED NONE DETECTED   Benzodiazepine, Ur Scrn NONE DETECTED NONE DETECTED   Methadone Scn, Ur NONE DETECTED NONE DETECTED  Urinalysis, Complete w Microscopic  Result Value Ref Range   Color, Urine YELLOW (A) YELLOW   APPearance CLEAR (A) CLEAR  Specific Gravity, Urine 1.014 1.005 - 1.030   pH 5.0 5.0 - 8.0   Glucose, UA NEGATIVE NEGATIVE mg/dL   Hgb urine dipstick NEGATIVE NEGATIVE   Bilirubin Urine NEGATIVE NEGATIVE   Ketones, ur NEGATIVE NEGATIVE mg/dL   Protein, ur NEGATIVE NEGATIVE mg/dL   Nitrite NEGATIVE NEGATIVE   Leukocytes, UA NEGATIVE NEGATIVE   RBC / HPF NONE SEEN 0 - 5 RBC/hpf   WBC, UA 0-5 0 - 5 WBC/hpf   Bacteria, UA NONE SEEN NONE SEEN   Squamous Epithelial / LPF 0-5 (A) NONE SEEN   Mucous PRESENT    Dg Foot Complete Left  Result Date: 11/11/2016 CLINICAL DATA:  Foot pain. EXAM: LEFT FOOT - COMPLETE 3+ VIEW COMPARISON:  None. FINDINGS: No fracture.  No bone lesion. There are minor degenerative changes at the first metatarsophalangeal joint. There is also small bunion from the medial first metatarsal head. Remaining joints are normally spaced and aligned. Small dorsal and plantar calcaneal spurs. Normal soft tissues. IMPRESSION: 1. No fracture or dislocation. 2.  Minor first metatarsophalangeal joint osteoarthritis. 3. Heel spurs. Electronically Signed   By: Lajean Manes M.D.   On: 11/11/2016 18:04

## 2016-12-13 ENCOUNTER — Encounter (INDEPENDENT_AMBULATORY_CARE_PROVIDER_SITE_OTHER): Payer: Self-pay

## 2016-12-13 ENCOUNTER — Ambulatory Visit: Payer: Self-pay | Admitting: Pharmacist

## 2016-12-13 VITALS — BP 130/80 | Ht 66.0 in | Wt 183.0 lb

## 2016-12-13 DIAGNOSIS — Z79899 Other long term (current) drug therapy: Secondary | ICD-10-CM

## 2016-12-13 NOTE — Progress Notes (Signed)
  Medication Management Clinic Visit Note  Patient: Meredith Juarez MRN: [id number] Date of Birth: [birth year] PCP: Patient, No Pcp Per   Meredith Juarez 56 y.o. female presents for an initial MTM visit today.  LMP  (LMP Unknown)   Patient Information   Past Medical History:  Diagnosis Date  . Anemia   . Depression   . Diverticulitis   . GERD (gastroesophageal reflux disease)   . Hypertension   . Migraines   . Stroke ([place])   . Thyroid disease      No past surgical history on file.   Family History  Problem Relation Age of Onset  . Cancer Mother        Uterine, Breast, Ovarian  . Hypertension Mother   . Hypertension Father   . Bipolar disorder Father   . CAD Sister   . Hyperlipidemia Sister     New Diagnoses (since last visit): admitted for suicidal ideation back in May and went to the ED on 11/11/16 for foot pain and depression.   Family Support: Poor  Lifestyle: no exercise at current time due to left foot Diet: Breakfast: bowl of cereal, eggs, toast Lunch: usually skip lunch  Dinner: chicken, fish, beef, mashed potatoes, vegetables Drinks: water, ginger ale            History  Alcohol Use No      History  Smoking Status  . Current Every Day Smoker  . Packs/day: 0.25  . Types: Cigarettes  Smokeless Tobacco  . Never Used      Health Maintenance  Topic Date Due  . Hepatitis C Screening  10-02-1960  . HIV Screening  03/02/1976  . TETANUS/TDAP  03/02/1980  . PAP SMEAR  03/02/1982  . MAMMOGRAM  03/03/2011  . COLONOSCOPY  03/03/2011  . INFLUENZA VACCINE  01/26/2017     Assessment and Plan:  1. Major depressive disorder - denies SI  - she is tired and doesn't have much of an appetite. Did not want to talk very much and no eye contact made during her visit - has not seen a counselor since leaving the hospital - does not feel that the sertraline and quetiapine are helping  - see a psychiatrist in July   2. Insomnia - has trouble sleeping  sometimes - takes trazodone but reports nightmares with this   3. Alcohol use disorder - denies alcohol use at this current time   4. CV disease - denies chest pain and palpitations - last used NTG 3 weeks ago  - continue aspirin and clopidogrel   5. HTN - blood pressure looks good today  - reports lightheadedness and dizziness everyday. She reports falling due to being dizzy - concerned this could be due to low blood pressures  - patient reports blood pressure between 127-130/ 75 at home   6. GERD - currently out of medication   Return in 1 year or sooner if questions or concerns.   Meredith Juarez, PharmD 12/13/2016 5:04 PM

## 2016-12-26 ENCOUNTER — Observation Stay
Admission: EM | Admit: 2016-12-26 | Discharge: 2016-12-28 | Disposition: A | Discharge status: Home / Self Care | Payer: Self-pay | Attending: Internal Medicine | Admitting: Internal Medicine

## 2016-12-26 ENCOUNTER — Encounter: Payer: Self-pay | Admitting: Emergency Medicine

## 2016-12-26 ENCOUNTER — Emergency Department: Payer: Self-pay

## 2016-12-26 DIAGNOSIS — Z79899 Other long term (current) drug therapy: Secondary | ICD-10-CM | POA: Insufficient documentation

## 2016-12-26 DIAGNOSIS — M19019 Primary osteoarthritis, unspecified shoulder: Secondary | ICD-10-CM | POA: Diagnosis present

## 2016-12-26 DIAGNOSIS — M25571 Pain in right ankle and joints of right foot: Secondary | ICD-10-CM | POA: Insufficient documentation

## 2016-12-26 DIAGNOSIS — K219 Gastro-esophageal reflux disease without esophagitis: Secondary | ICD-10-CM | POA: Insufficient documentation

## 2016-12-26 DIAGNOSIS — M19079 Primary osteoarthritis, unspecified ankle and foot: Secondary | ICD-10-CM | POA: Diagnosis present

## 2016-12-26 DIAGNOSIS — F32A Depression, unspecified: Secondary | ICD-10-CM | POA: Diagnosis present

## 2016-12-26 DIAGNOSIS — M19071 Primary osteoarthritis, right ankle and foot: Secondary | ICD-10-CM | POA: Insufficient documentation

## 2016-12-26 DIAGNOSIS — R200 Anesthesia of skin: Secondary | ICD-10-CM | POA: Insufficient documentation

## 2016-12-26 DIAGNOSIS — M79604 Pain in right leg: Secondary | ICD-10-CM | POA: Insufficient documentation

## 2016-12-26 DIAGNOSIS — R42 Dizziness and giddiness: Secondary | ICD-10-CM | POA: Insufficient documentation

## 2016-12-26 DIAGNOSIS — Z8249 Family history of ischemic heart disease and other diseases of the circulatory system: Secondary | ICD-10-CM | POA: Insufficient documentation

## 2016-12-26 DIAGNOSIS — F329 Major depressive disorder, single episode, unspecified: Secondary | ICD-10-CM | POA: Insufficient documentation

## 2016-12-26 DIAGNOSIS — F17210 Nicotine dependence, cigarettes, uncomplicated: Secondary | ICD-10-CM | POA: Insufficient documentation

## 2016-12-26 DIAGNOSIS — Z8673 Personal history of transient ischemic attack (TIA), and cerebral infarction without residual deficits: Secondary | ICD-10-CM

## 2016-12-26 DIAGNOSIS — Z7982 Long term (current) use of aspirin: Secondary | ICD-10-CM | POA: Insufficient documentation

## 2016-12-26 DIAGNOSIS — M19011 Primary osteoarthritis, right shoulder: Secondary | ICD-10-CM | POA: Insufficient documentation

## 2016-12-26 DIAGNOSIS — Z7902 Long term (current) use of antithrombotics/antiplatelets: Secondary | ICD-10-CM | POA: Insufficient documentation

## 2016-12-26 DIAGNOSIS — M79673 Pain in unspecified foot: Secondary | ICD-10-CM

## 2016-12-26 DIAGNOSIS — M25511 Pain in right shoulder: Secondary | ICD-10-CM

## 2016-12-26 DIAGNOSIS — G43909 Migraine, unspecified, not intractable, without status migrainosus: Secondary | ICD-10-CM | POA: Insufficient documentation

## 2016-12-26 DIAGNOSIS — R531 Weakness: Principal | ICD-10-CM

## 2016-12-26 DIAGNOSIS — E876 Hypokalemia: Secondary | ICD-10-CM | POA: Insufficient documentation

## 2016-12-26 DIAGNOSIS — I10 Essential (primary) hypertension: Secondary | ICD-10-CM | POA: Diagnosis present

## 2016-12-26 DIAGNOSIS — R202 Paresthesia of skin: Secondary | ICD-10-CM | POA: Insufficient documentation

## 2016-12-26 DIAGNOSIS — M1990 Unspecified osteoarthritis, unspecified site: Secondary | ICD-10-CM | POA: Diagnosis present

## 2016-12-26 DIAGNOSIS — M79605 Pain in left leg: Secondary | ICD-10-CM | POA: Insufficient documentation

## 2016-12-26 LAB — DIFFERENTIAL
Basophils Absolute: 0.1 10*3/uL (ref 0–0.1)
Basophils Relative: 1 %
EOS PCT: 1 %
Eosinophils Absolute: 0.1 10*3/uL (ref 0–0.7)
LYMPHS ABS: 1.7 10*3/uL (ref 1.0–3.6)
LYMPHS PCT: 25 %
MONO ABS: 0.5 10*3/uL (ref 0.2–0.9)
Monocytes Relative: 7 %
Neutro Abs: 4.6 10*3/uL (ref 1.4–6.5)
Neutrophils Relative %: 66 %

## 2016-12-26 LAB — COMPREHENSIVE METABOLIC PANEL
ALK PHOS: 62 U/L (ref 38–126)
ALT: 15 U/L (ref 14–54)
ANION GAP: 8 (ref 5–15)
AST: 30 U/L (ref 15–41)
Albumin: 3.4 g/dL — ABNORMAL LOW (ref 3.5–5.0)
BILIRUBIN TOTAL: 1 mg/dL (ref 0.3–1.2)
BUN: 8 mg/dL (ref 6–20)
CALCIUM: 8.7 mg/dL — AB (ref 8.9–10.3)
CO2: 28 mmol/L (ref 22–32)
CREATININE: 0.63 mg/dL (ref 0.44–1.00)
Chloride: 101 mmol/L (ref 101–111)
GFR calc non Af Amer: >60 mL/min (ref >60)
GLUCOSE: 100 mg/dL — AB (ref 65–99)
Potassium: 2.7 mmol/L — CL (ref 3.5–5.1)
Sodium: 137 mmol/L (ref 135–145)
TOTAL PROTEIN: 5.9 g/dL — AB (ref 6.5–8.1)

## 2016-12-26 LAB — CBC
HEMATOCRIT: 42.5 % (ref 35.0–47.0)
HEMOGLOBIN: 14.5 g/dL (ref 12.0–16.0)
MCH: 27.3 pg (ref 26.0–34.0)
MCHC: 34 g/dL (ref 32.0–36.0)
MCV: 80.2 fL (ref 80.0–100.0)
Platelets: 277 10*3/uL (ref 150–440)
RBC: 5.3 MIL/uL — AB (ref 3.80–5.20)
RDW: 13.9 % (ref 11.5–14.5)
WBC: 6.9 10*3/uL (ref 3.6–11.0)

## 2016-12-26 LAB — TROPONIN I: Troponin I: <0.03 ng/mL (ref <0.03)

## 2016-12-26 LAB — PROTIME-INR
INR: 0.94
PROTHROMBIN TIME: 12.6 s (ref 11.4–15.2)

## 2016-12-26 LAB — APTT: aPTT: 24 seconds (ref 24–36)

## 2016-12-26 MED ORDER — POTASSIUM CHLORIDE CRYS ER 20 MEQ PO TBCR
40.0000 meq | EXTENDED_RELEASE_TABLET | Freq: Once | ORAL | Status: AC
  Administered 2016-12-27: 40 meq via ORAL
  Filled 2016-12-26: qty 2

## 2016-12-26 NOTE — ED Notes (Signed)
Critical potassium of 2.7 called from lab. Dr. Clearnce Hasten notified, no new orders received.

## 2016-12-26 NOTE — ED Notes (Signed)
Pt states has been intermittently dizzy, has had an intermittent headache for 3 days and increased right shoulder pain with movement.

## 2016-12-26 NOTE — ED Triage Notes (Signed)
Pt states she had generalized weakness that is more noticible on right side. Pt states "i don't know when it started". Pt states she had high blood pressure tonight, began to have a headache, tingling in feet and arms. Pt with less strength noted to right hand grip on exam. Pt with slight asymmetry noted to right side of mouth with smiling. Pt complains of right arm decreased sensation, right face decreased sensation and right leg decreased sensation. md at bedside. fsbs with ems 122.

## 2016-12-26 NOTE — ED Provider Notes (Signed)
[facility] Emergency Department Provider Note  ____________________________________________   First MD Initiated Contact with Patient 12/26/16 2259     (approximate)  I have reviewed the triage vital signs and the nursing notes.   HISTORY  Chief Complaint Cerebrovascular Accident; Dizziness; and Weakness   HPI Meredith Juarez is a 56 y.o. female with a history of stroke on Plavix was presenting to the emergency department today with right-sided numbness and weakness. She is unsure of the time of onset. She says that she has been sleeping most of the day and woke up this evening after was dark outside with the symptoms. She is also complaining of some mild pain in her right ankle. She says that she has degenerative osteoarthritis of the left ankle but is unaware of this diagnosis on the right. She says that a friend of hers called the ambulance concern when the symptoms were first noted this evening. The patient again, is unclear of the exact time of onset but says that she last felt normal during the daytime when was light outside. The patient arrived at the emergency department tonight shortly after 11 PM.   Past Medical History:  Diagnosis Date  . Anemia   . Depression   . Diverticulitis   . GERD (gastroesophageal reflux disease)   . Hypertension   . Migraines   . Stroke ([place])   . Thyroid disease     Patient Active Problem List   Diagnosis Date Noted  . GERD (gastroesophageal reflux disease) 11/07/2016  . Tobacco use disorder 11/07/2016  . Alcohol use disorder, severe, dependence ([place]) 11/07/2016  . Severe episode of recurrent major depressive disorder, without psychotic features ([place])   . Migraines 09/05/2014  . Hypertension 01/01/2014    History reviewed. No pertinent surgical history.  Prior to Admission medications   Medication Sig Start Date End Date Taking? Authorizing Provider  acetaminophen (TYLENOL) 500 MG tablet Take 500 mg by mouth  every 4 (four) hours as needed.    [provider]  amLODipine (NORVASC) 10 MG tablet Take 1 tablet (10 mg total) by mouth daily. 11/10/16   Pucilowska, Herma Ard B, MD  aspirin EC 81 MG tablet Take 1 tablet (81 mg total) by mouth daily. 11/09/16   Pucilowska, Herma Ard B, MD  clopidogrel (PLAVIX) 75 MG tablet Take 1 tablet (75 mg total) by mouth daily. 11/09/16   Pucilowska, Jolanta B, MD  hydrochlorothiazide (HYDRODIURIL) 25 MG tablet Take 1 tablet (25 mg total) by mouth daily. 11/09/16   Pucilowska, Herma Ard B, MD  ibuprofen (ADVIL,MOTRIN) 600 MG tablet Take 1 tablet (600 mg total) by mouth every 8 (eight) hours as needed. 11/11/16   Darel Hong, MD  metoprolol tartrate (LOPRESSOR) 50 MG tablet Take 1 tablet (50 mg total) by mouth daily. 11/09/16 11/09/17  Pucilowska, Wardell Honour, MD  nitroGLYCERIN (NITROSTAT) 0.4 MG SL tablet Place 1 tablet (0.4 mg total) under the tongue every 5 (five) minutes as needed for chest pain. 11/09/16   Pucilowska, Jolanta B, MD  potassium chloride SA (K-DUR,KLOR-CON) 20 MEQ tablet Take 1 tablet (20 mEq total) by mouth daily. 11/09/16   Pucilowska, Herma Ard B, MD  QUEtiapine (SEROQUEL) 200 MG tablet Take 1 tablet (200 mg total) by mouth at bedtime. 11/09/16   Pucilowska, Herma Ard B, MD  sertraline (ZOLOFT) 100 MG tablet Take 1 tablet (100 mg total) by mouth daily. 11/09/16   Pucilowska, Herma Ard B, MD  traZODone (DESYREL) 100 MG tablet Take 1 tablet (100 mg total) by mouth at  bedtime. 1/2 - 1 tablet as needed 11/09/16   Pucilowska, Wardell Honour, MD    Allergies Patient has no known allergies.  Family History  Problem Relation Age of Onset  . Cancer Mother        Uterine, Breast, Ovarian  . Hypertension Mother   . Hypertension Father   . Bipolar disorder Father   . CAD Sister   . Hyperlipidemia Sister     Social History Social History  Substance Use Topics  . Smoking status: Current Every Day Smoker    Packs/day: 0.25    Types: Cigarettes  . Smokeless tobacco:  Never Used  . Alcohol use No    Review of Systems  Constitutional: No fever/chills Eyes: No visual changes. ENT: No sore throat. Cardiovascular: Denies chest pain. Respiratory: Denies shortness of breath. Gastrointestinal: No abdominal pain.  No nausea, no vomiting.  No diarrhea.  No constipation. Genitourinary: Negative for dysuria. Musculoskeletal: Negative for back pain. Skin: Negative for rash. Neurological: Negative for headache   ____________________________________________   PHYSICAL EXAM:  VITAL SIGNS: ED Triage Vitals  Enc Vitals Group     BP 12/26/16 2302 (!) 143/105     Pulse Rate 12/26/16 2302 75     Resp 12/26/16 2302 16     Temp 12/26/16 2302 98.4 F (36.9 C)     Temp Source 12/26/16 2302 Oral     SpO2 12/26/16 2302 98 %     Weight 12/26/16 2257 178 lb (80.7 kg)     Height 12/26/16 2257 5\' 6"  (1.676 m)     Head Circumference --      Peak Flow --      Pain Score --      Pain Loc --      Pain Edu? --      Excl. in [place]? --     Constitutional: Alert and oriented. Well appearing and in no acute distress. Eyes: Conjunctivae are normal.  Head: Atraumatic. Nose: No congestion/rhinnorhea. Mouth/Throat: Mucous membranes are moist.  Neck: No stridor.   Cardiovascular: Normal rate, regular rhythm. Grossly normal heart sounds.   Respiratory: Normal respiratory effort.  No retractions. Lungs CTAB. Gastrointestinal: Soft and nontender. No distention. No CVA tenderness. Musculoskeletal: No lower extremity tenderness nor edema.  No joint effusions. Neurologic:  Normal speech and language. 4 out of 5 grip strength to the right upper extremity. Also with decreased sensation to light touch to the right side of the face as well as right upper lower extremities. Skin:  Skin is warm, dry and intact. No rash noted. Psychiatric: Mood and affect are normal. Speech and behavior are normal.  NIH Stroke Scale   Person Administering Scale: Doran Stabler  Administer  stroke scale items in the order listed. Record performance in each category after each subscale exam. Do not go back and change scores. Follow directions provided for each exam technique. Scores should reflect what the patient does, not what the clinician thinks the patient can do. The clinician should record answers while administering the exam and work quickly. Except where indicated, the patient should not be coached (i.e., repeated requests to patient to make a special effort).   1a  Level of consciousness: 0=alert; keenly responsive  1b. LOC questions:  0=Performs both tasks correctly  1c. LOC commands: 0=Performs both tasks correctly  2.  Best Gaze: 0=normal  3.  Visual: 0=No visual loss  4. Facial Palsy: 0=Normal symmetric movement  5a.  Motor left arm: 0=No drift, limb holds  90 (or 45) degrees for full 10 seconds  5b.  Motor right arm: 0=No drift, limb holds 90 (or 45) degrees for full 10 seconds  6a. motor left leg: 0=No drift, limb holds 90 (or 45) degrees for full 10 seconds  6b  Motor right leg:  0=No drift, limb holds 90 (or 45) degrees for full 10 seconds  7. Limb Ataxia: 0=Absent  8.  Sensory: 1=Mild to moderate sensory loss; patient feels pinprick is less sharp or is dull on the affected side; there is a loss of superficial pain with pinprick but patient is aware She is being touched  9. Best Language:  0=No aphasia, normal  10. Dysarthria: 0=Normal  11. Extinction and Inattention: 0=No abnormality  12. Distal motor function: 0=Normal   Total:   1   Patient without any drift but with decreased grip strength to the right hand. ____________________________________________   LABS (all labs ordered are listed, but only abnormal results are displayed)  Labs Reviewed  CBC - Abnormal; Notable for the following:       Result Value   RBC 5.30 (*)    All other components within normal limits  PROTIME-INR  APTT  DIFFERENTIAL  COMPREHENSIVE METABOLIC PANEL  TROPONIN I    ____________________________________________  EKG  ED ECG REPORT I, Doran Stabler, the attending physician, personally viewed and interpreted this ECG.   Date: 12/26/2016  EKG Time: 2300  Rate: 74  Rhythm: normal sinus rhythm  Axis: normal  Intervals:none  ST&T Change: No ST segment elevation or depression. No abnormal T-wave inversions.  ____________________________________________  RADIOLOGY  CT of the brain without any acute finding. ____________________________________________   PROCEDURES  Procedure(s) performed:   Procedures  Critical Care performed:   ____________________________________________   INITIAL IMPRESSION / ASSESSMENT AND PLAN / ED COURSE  Pertinent labs & imaging results that were available during my care of the patient were reviewed by me and considered in my medical decision making (see chart for details).  Patient with unknown time of symptom onset. Patient is not a TPA candidate.    ----------------------------------------- 11:52 PM on 12/26/2016 -----------------------------------------  Concern is for CVA. However, we will also replete the patient's potassium. Patient were made to be admitted to the hospital. She also reports taking her aspirin and Plavix today prior to coming to the hospital. Signed out to Dr. Jannifer Franklin.  ____________________________________________   FINAL CLINICAL IMPRESSION(S) / ED DIAGNOSES  Right-sided weakness and numbness. Hypo-kalemia    NEW MEDICATIONS STARTED DURING THIS VISIT:  New Prescriptions   No medications on file     Note:  This document was prepared using Dragon voice recognition software and may include unintentional dictation errors.     Orbie Pyo, MD 12/26/16 [phone]

## 2016-12-26 NOTE — ED Notes (Signed)
Pt to ct scan.

## 2016-12-27 ENCOUNTER — Observation Stay: Payer: Self-pay

## 2016-12-27 ENCOUNTER — Encounter: Payer: Self-pay | Admitting: Internal Medicine

## 2016-12-27 DIAGNOSIS — F329 Major depressive disorder, single episode, unspecified: Secondary | ICD-10-CM | POA: Diagnosis present

## 2016-12-27 DIAGNOSIS — Z8673 Personal history of transient ischemic attack (TIA), and cerebral infarction without residual deficits: Secondary | ICD-10-CM

## 2016-12-27 DIAGNOSIS — F32A Depression, unspecified: Secondary | ICD-10-CM | POA: Diagnosis present

## 2016-12-27 DIAGNOSIS — R531 Weakness: Secondary | ICD-10-CM

## 2016-12-27 LAB — LIPID PANEL
CHOL/HDL RATIO: 4 ratio
CHOLESTEROL: 171 mg/dL (ref 0–200)
HDL: 43 mg/dL (ref >40)
LDL Cholesterol: 48 mg/dL (ref 0–99)
Triglycerides: 400 mg/dL — ABNORMAL HIGH (ref <150)
VLDL: 80 mg/dL — AB (ref 0–40)

## 2016-12-27 LAB — MAGNESIUM: Magnesium: 1.8 mg/dL (ref 1.7–2.4)

## 2016-12-27 MED ORDER — TRAZODONE HCL 50 MG PO TABS
100.0000 mg | ORAL_TABLET | Freq: Every day | ORAL | Status: DC
  Filled 2016-12-27: qty 2

## 2016-12-27 MED ORDER — ACETAMINOPHEN 160 MG/5ML PO SOLN
650.0000 mg | ORAL | Status: DC | PRN
Start: 2016-12-27 — End: 2016-12-28

## 2016-12-27 MED ORDER — SERTRALINE HCL 50 MG PO TABS
100.0000 mg | ORAL_TABLET | Freq: Every day | ORAL | Status: DC
  Administered 2016-12-27: 50 mg via ORAL
  Administered 2016-12-28: 100 mg via ORAL
  Filled 2016-12-27 (×2): qty 2

## 2016-12-27 MED ORDER — ENOXAPARIN SODIUM 40 MG/0.4ML ~~LOC~~ SOLN
40.0000 mg | SUBCUTANEOUS | Status: DC
  Administered 2016-12-27: 40 mg via SUBCUTANEOUS
  Filled 2016-12-27: qty 0.4

## 2016-12-27 MED ORDER — ASPIRIN EC 81 MG PO TBEC
81.0000 mg | DELAYED_RELEASE_TABLET | Freq: Every day | ORAL | Status: DC
  Administered 2016-12-27 – 2016-12-28 (×2): 81 mg via ORAL
  Filled 2016-12-27 (×2): qty 1

## 2016-12-27 MED ORDER — STROKE: EARLY STAGES OF RECOVERY BOOK
Freq: Once | Status: AC
  Administered 2016-12-27: 03:00:00

## 2016-12-27 MED ORDER — POTASSIUM CHLORIDE CRYS ER 20 MEQ PO TBCR
40.0000 meq | EXTENDED_RELEASE_TABLET | ORAL | Status: AC
  Administered 2016-12-27 (×2): 40 meq via ORAL
  Filled 2016-12-27 (×2): qty 2

## 2016-12-27 MED ORDER — ATORVASTATIN CALCIUM 20 MG PO TABS
40.0000 mg | ORAL_TABLET | Freq: Every day | ORAL | Status: DC
  Filled 2016-12-27: qty 2

## 2016-12-27 MED ORDER — GADOBENATE DIMEGLUMINE 529 MG/ML IV SOLN
15.0000 mL | Freq: Once | INTRAVENOUS | Status: AC | PRN
  Administered 2016-12-27: 15 mL via INTRAVENOUS

## 2016-12-27 MED ORDER — LORAZEPAM 1 MG PO TABS
1.0000 mg | ORAL_TABLET | Freq: Once | ORAL | Status: DC
  Filled 2016-12-27: qty 1

## 2016-12-27 MED ORDER — ATORVASTATIN CALCIUM 40 MG PO TABS: 40.0000 mg | ORAL_TABLET | Freq: Every day | ORAL | 0 refills | Status: DC

## 2016-12-27 MED ORDER — ACETAMINOPHEN 325 MG PO TABS
650.0000 mg | ORAL_TABLET | ORAL | Status: DC | PRN
Start: 2016-12-27 — End: 2016-12-28
  Administered 2016-12-27 – 2016-12-28 (×3): 650 mg via ORAL
  Filled 2016-12-27 (×3): qty 2

## 2016-12-27 MED ORDER — QUETIAPINE FUMARATE 200 MG PO TABS
200.0000 mg | ORAL_TABLET | Freq: Every day | ORAL | Status: DC
  Filled 2016-12-27 (×3): qty 1

## 2016-12-27 MED ORDER — ACETAMINOPHEN 650 MG RE SUPP
650.0000 mg | RECTAL | Status: DC | PRN
Start: 2016-12-27 — End: 2016-12-28

## 2016-12-27 MED ORDER — CLOPIDOGREL BISULFATE 75 MG PO TABS
75.0000 mg | ORAL_TABLET | Freq: Every day | ORAL | Status: DC
  Administered 2016-12-27 – 2016-12-28 (×2): 75 mg via ORAL
  Filled 2016-12-27 (×2): qty 1

## 2016-12-27 NOTE — Progress Notes (Signed)
OT Cancellation Note  Patient Details Name: Yocheved Depner MRN: [id number] DOB: 1961-02-07   Cancelled Treatment:    Reason Eval/Treat Not Completed: Medical issues which prohibited therapy (Pt. with potassium level at 2.7. [place] didn't show any acute abnormalities. Pt. waiting x-rays of right shoulder and foot secondary to right shoulder pain, and RUE weakness. Will continue to monitor, and intervene when appropriate.  )  Harrel Carina, MS, OTR/L 12/27/2016, 11:37 AM

## 2016-12-27 NOTE — Progress Notes (Signed)
Initial Nutrition Assessment  DOCUMENTATION CODES:   Not applicable  INTERVENTION:  Patient refusing nutrition assessment and interventions. Will continue to monitor during admission.  NUTRITION DIAGNOSIS:   Other (Comment) (No nutrition diagnosis at this time)   GOAL:   Patient will meet greater than or equal to 90% of their needs  MONITOR:   PO intake, Labs, Weight trends, I & O's  REASON FOR ASSESSMENT:   Malnutrition Screening Tool    ASSESSMENT:   56 year old female with PMHx of HTN, migraines, depression, hx CVA, GERD, thyroid disease, diverticulitis who presented with right-sided weakness likely related to right shoulder pain. Per neurology likely not related to stroke.   Attempted to speak with patient at bedside, but she reports she does not want to speak with an RD. Reports she has had a poor appetite for a few months but it varies. She eats "something" every day, just small portions sometimes. Reports it may also be related to her depression. Would not give any further details and asked this RD to leave room.  Reports she used to weigh 205 lbs. Per chart patient appears weight stable this year.  Meal Completion: 90% of breakfast this morning per chart  Medications reviewed and include: Ativan, potassium chloride 40 mEq Q3hrs today, Seroquel, sertraline.  Labs reviewed: Potassium 3.7.   Unable to complete Nutrition-Focused physical exam at this time as patient is refusing. On visualization no overt signs of muscle or fat wasting, but difficult to tell without full exam.  Unable to tell if patient meets criteria for malnutrition due to limited participation in assessment and refusal of NFPE.  Discussed with RN.  Diet Order:  Diet Heart Room service appropriate? Yes; Fluid consistency: Thin  Skin:  Reviewed, no issues  Last BM:  12/26/2016  Height:   Ht Readings from Last 1 Encounters:  12/27/16 5\' 6"  (1.676 m)    Weight:   Wt Readings from Last 1  Encounters:  12/27/16 181 lb 4.8 oz (82.2 kg)    Ideal Body Weight:  59.1 kg  BMI:  Body mass index is 29.26 kg/m.  Estimated Nutritional Needs:   Kcal:  1725-2010 (MSJ x 1.2-1.4)  Protein:  82-100 grams (1-1.2 grams/kg)  Fluid:  1.7-2 L/day (1 ml/kcal)  EDUCATION NEEDS:   Education needs no appropriate at this time  Willey Blade, MS, RD, LDN Pager: [phone] After Hours Pager: [phone]

## 2016-12-27 NOTE — Consult Note (Signed)
Reason for Consult: R sided numbness  Referring Physician: Dr. Darvin Neighbours  CC: R sided numbness   HPI: Meredith Juarez is an 56 y.o. female who presents with RUE numbness and weakness.  Patient states that she woke up from a nap and had right sided weakness, as well as bilateral upper extremity tingling numbness, and bilateral lower extremity pain. Currently back to baseline. Pt complains of RUE weakness but it appears it is related to pain coming out of R shoulder.   Past Medical History:  Diagnosis Date  . Anemia   . Depression   . Diverticulitis   . GERD (gastroesophageal reflux disease)   . Hypertension   . Migraines   . Stroke ([place])   . Thyroid disease     Past Surgical History:  Procedure Laterality Date  . NO PAST SURGERIES      Family History  Problem Relation Age of Onset  . Cancer Mother        Uterine, Breast, Ovarian  . Hypertension Mother   . Hypertension Father   . Bipolar disorder Father   . CAD Sister   . Hyperlipidemia Sister     Social History:  reports that she has been smoking Cigarettes.  She has been smoking about 0.25 packs per day. She has never used smokeless tobacco. She reports that she drinks alcohol. She reports that she does not use drugs.  No Known Allergies  Medications: I have reviewed the patient's current medications.  ROS: History obtained from the patient  General ROS: negative for - chills, fatigue, fever, night sweats, weight gain or weight loss Psychological ROS: negative for - behavioral disorder, hallucinations, memory difficulties, mood swings or suicidal ideation Ophthalmic ROS: negative for - blurry vision, double vision, eye pain or loss of vision ENT ROS: negative for - epistaxis, nasal discharge, oral lesions, sore throat, tinnitus or vertigo Allergy and Immunology ROS: negative for - hives or itchy/watery eyes Hematological and Lymphatic ROS: negative for - bleeding problems, bruising or swollen lymph nodes Endocrine ROS:  negative for - galactorrhea, hair pattern changes, polydipsia/polyuria or temperature intolerance Respiratory ROS: negative for - cough, hemoptysis, shortness of breath or wheezing Cardiovascular ROS: negative for - chest pain, dyspnea on exertion, edema or irregular heartbeat Gastrointestinal ROS: negative for - abdominal pain, diarrhea, hematemesis, nausea/vomiting or stool incontinence Genito-Urinary ROS: negative for - dysuria, hematuria, incontinence or urinary frequency/urgency Musculoskeletal ROS: negative for - joint swelling or muscular weakness Neurological ROS: as noted in HPI Dermatological ROS: negative for rash and skin lesion changes  Physical Examination: Blood pressure 103/64, pulse 70, temperature 98 F (36.7 C), resp. rate 18, height 5\' 6"  (1.676 m), weight 82.2 kg (181 lb 4.8 oz), SpO2 96 %.    Neurological Examination   Mental Status: Alert, oriented, thought content appropriate.  Speech fluent without evidence of aphasia.  Able to follow 3 step commands without difficulty. Cranial Nerves: II: Discs flat bilaterally; Visual fields grossly normal, pupils equal, round, reactive to light and accommodation III,IV, VI: ptosis not present, extra-ocular motions intact bilaterally V,VII: smile symmetric, facial light touch sensation normal bilaterally VIII: hearing normal bilaterally IX,X: gag reflex present XI: bilateral shoulder shrug XII: midline tongue extension Motor: Right : Upper extremity   5/5   Left:     Upper extremity   5/5  Lower extremity   5/5     Lower extremity   5/5 Tone and bulk:normal tone throughout; no atrophy noted Sensory: Pinprick and light touch intact throughout, bilaterally Deep Tendon  Reflexes: 1+ and symmetric throughout Plantars: Right: downgoing   Left: downgoing Cerebellar: normal finger-to-nose, normal rapid alternating movements and normal heel-to-shin test Gait: not tested      Laboratory Studies:   Basic Metabolic  Panel:  Recent Labs Lab 12/26/16 2303 12/27/16 0514  NA 137  --   K 2.7*  --   CL 101  --   CO2 28  --   GLUCOSE 100*  --   BUN 8  --   CREATININE 0.63  --   CALCIUM 8.7*  --   MG  --  1.8    Liver Function Tests:  Recent Labs Lab 12/26/16 2303  AST 30  ALT 15  ALKPHOS 62  BILITOT 1.0  PROT 5.9*  ALBUMIN 3.4*   No results for input(s): LIPASE, AMYLASE in the last 168 hours. No results for input(s): AMMONIA in the last 168 hours.  CBC:  Recent Labs Lab 12/26/16 2303  WBC 6.9  NEUTROABS 4.6  HGB 14.5  HCT 42.5  MCV 80.2  PLT 277    Cardiac Enzymes:  Recent Labs Lab 12/26/16 2303  TROPONINI <0.03    BNP: Invalid input(s): POCBNP  CBG: No results for input(s): GLUCAP in the last 168 hours.  Microbiology: Results for orders placed or performed during the hospital encounter of 08/01/16  Urine culture     Status: Abnormal   Collection Time: 08/01/16  4:43 PM  Result Value Ref Range Status   Specimen Description URINE, RANDOM  Final   Special Requests NONE  Final   Culture (A)  Final    <10,000 COLONIES/mL INSIGNIFICANT GROWTH Performed at [facility], [place] [address] [place], [place] [address]    Report Status 08/03/2016 FINAL  Final    Coagulation Studies:  Recent Labs  12/26/16 2303  LABPROT 12.6  INR 0.94    Urinalysis: No results for input(s): COLORURINE, LABSPEC, PHURINE, GLUCOSEU, HGBUR, BILIRUBINUR, KETONESUR, PROTEINUR, UROBILINOGEN, NITRITE, LEUKOCYTESUR in the last 168 hours.  Invalid input(s): APPERANCEUR  Lipid Panel:     Component Value Date/Time   CHOL 171 12/27/2016 0514   CHOL 157 05/08/2013 0531   TRIG 400 (H) 12/27/2016 0514   TRIG 246 (H) 05/08/2013 0531   HDL 43 12/27/2016 0514   HDL 43 05/08/2013 0531   CHOLHDL 4.0 12/27/2016 0514   VLDL 80 (H) 12/27/2016 0514   VLDL 49 (H) 05/08/2013 0531   LDLCALC 48 12/27/2016 0514   LDLCALC 65 05/08/2013 0531    HgbA1C:  Lab Results  Component Value Date    HGBA1C 5.5 11/08/2016    Urine Drug Screen:     Component Value Date/Time   LABOPIA NONE DETECTED 11/06/2016 0430   COCAINSCRNUR NONE DETECTED 11/06/2016 0430   LABBENZ NONE DETECTED 11/06/2016 0430   AMPHETMU NONE DETECTED 11/06/2016 0430   THCU NONE DETECTED 11/06/2016 0430   LABBARB NONE DETECTED 11/06/2016 0430    Alcohol Level: No results for input(s): ETH in the last 168 hours.  Other results: EKG: normal EKG, normal sinus rhythm, unchanged from previous tracings.  Imaging: Ct Head Code Stroke W/o Cm  Result Date: 12/26/2016 CLINICAL DATA:  Code stroke.  56 y/o  F; right-sided weakness. EXAM: CT HEAD WITHOUT CONTRAST TECHNIQUE: Contiguous axial images were obtained from the base of the skull through the vertex without intravenous contrast. COMPARISON:  08/01/2016 CT head. FINDINGS: Brain: No evidence of acute infarction, hemorrhage, hydrocephalus, extra-axial collection or mass lesion/mass effect. Vascular: Mild calcific atherosclerosis of carotid bifurcations. Skull:  Normal. Negative for fracture or focal lesion. Sinuses/Orbits: No acute finding. Other: None. ASPECTS [facility] Stroke Program Early CT Score) - Ganglionic level infarction (caudate, lentiform nuclei, internal capsule, insula, M1-M3 cortex): 7 - Supraganglionic infarction (M4-M6 cortex): 3 Total score (0-10 with 10 being normal): 10 IMPRESSION: 1. No acute intracranial abnormality identified. Unremarkable CT of head. 2. ASPECTS is 10 These results were called by telephone at the time of interpretation on 12/26/2016 at 11:26 pm to Dr. Larae Grooms , who verbally acknowledged these results. Electronically Signed   By: Kristine Garbe M.D.   On: 12/26/2016 23:27     Assessment/Plan:  56 y.o. female who presents with RUE numbness and weakness.  Patient states that she woke up from a nap and had right sided weakness, as well as bilateral upper extremity tingling numbness, and bilateral lower extremity pain.  Currently back to baseline. Pt complains of RUE weakness but it appears it is related to pain coming out of R shoulder.   CTH done and didn't show any acute abnormalities.  I don't thinks symptoms are stroke related but likely related to R shoulder pain Would obtain X ray R shoulder and possible d/c planning No further imaging from neuro stand point  12/27/2016, 10:03 AM

## 2016-12-27 NOTE — Progress Notes (Signed)
SLP Cancellation Note  Patient Details Name: Meredith Juarez MRN: [id number] DOB: [birth year]   Cancelled treatment:       Reason Eval/Treat Not Completed: SLP screened, no needs identified, will sign off (chart reviewed; consulted NSG then met w/ pt)  Pt denied any difficulty swallowing and is currently on a regular diet; tolerates swallowing pills w/ water per NSG. Pt did c/o diarrhea. Pt conversed at conversational level w/out deficits noted; pt and family denied any speech-language deficits.  No further skilled ST services indicated as pt appears at her baseline. Pt agreed. NSG to reconsult if any change in status.     Orinda Kenner, MS, CCC-SLP Watson,Katherine 12/27/2016, 6:48 PM

## 2016-12-27 NOTE — Evaluation (Signed)
Physical Therapy Evaluation Patient Details Name: Meredith Juarez MRN: [id number] DOB: 01/01/1961 Today's Date: 12/27/2016   History of Present Illness  presented to ER secondary to R sided weakness; admitted under observation to rule out TIA/CVA. Head CT, MRI negative for acute infarct; R shoulder/foot x-ray imaging negative for acute osseous injury.  Clinical Impression  Upon evaluation, patient alert and oriented; follows all commands, but demonstrates limited interest/engagement with therapist cuing and recommendations.  R shoulder elevation limited, all planes, to approx 80 degrees; tends to maintain in guarded position across abdomen with all mobility efforts.  Attempted to education in self ROM HEP (within pain-free range); patient disinterested in education at this time. Demonstrates ability to complete supine/sit with mod indep; sit/stand, basic transfers and gait (140') with RW, cga/close sup. Reciprocal stepping pattern with fair step height/length and weight acceptance to R LE; min cuing for R UE position/grasp on RW.  Refused further distance due to pain in "heel spurs" and declined attempts at stairs, as she wasn't "in the mood for that right now". Would benefit from skilled PT to address above deficits and promote optimal return to PLOF; recommend transition to home with outpatient PT/OT follow up as needed to address chronic R shoulder deficits.    Follow Up Recommendations Outpatient PT    Equipment Recommendations  Rolling walker with 5" wheels    Recommendations for Other Services       Precautions / Restrictions Precautions Precautions: Fall Restrictions Weight Bearing Restrictions: No      Mobility  Bed Mobility Overal bed mobility: Modified Independent                Transfers Overall transfer level: Needs assistance Equipment used: Rolling walker (2 wheeled);None Transfers: Sit to/from Stand Sit to Stand: Supervision         General transfer  comment: impulsive, but fair/good strength and power with all movement transitions  Ambulation/Gait Ambulation/Gait assistance: Min guard;Supervision Ambulation Distance (Feet): 140 Feet Assistive device: Rolling walker (2 wheeled)       General Gait Details: reciprocal stepping pattern with fair stance time/weight shift to R LE; min cuing for R UE placement/grasp on RW (tends ot maintain in guarded position against side/abdomen).  Fair cadence/gait speed without buckling or LOB.  Declined further distance due to pain in "heel spurs" aggravated by walking  Stairs Stairs:  (refused stair assessment this pm, "I ain't even in the mood for that")          Wheelchair Mobility    Modified Rankin (Stroke Patients Only)       Balance Overall balance assessment: Needs assistance Sitting-balance support: No upper extremity supported;Feet supported Sitting balance-Leahy Scale: Normal     Standing balance support: No upper extremity supported;Bilateral upper extremity supported Standing balance-Leahy Scale: Fair                               Pertinent Vitals/Pain Pain Assessment: Faces Faces Pain Scale: Hurts even more Pain Location: R shoulder Pain Descriptors / Indicators: Aching;Guarding Pain Intervention(s): Limited activity within patient's tolerance;Monitored during session;Repositioned;Patient requesting pain meds-RN notified    Home Living Family/patient expects to be discharged to:: Private residence Living Arrangements: Non-relatives/Friends Available Help at Discharge: Family;Available 24 hours/day Type of Home: House Home Access: Stairs to enter Entrance Stairs-Rails: None Entrance Stairs-Number of Steps: 2 Home Layout: Two level;Bed/bath upstairs Home Equipment: Cane - single point      Prior Function Level of  Independence: Independent with assistive device(s)         Comments: Intermittent use of SPC for ADLs, household and community mobility      Hand Dominance   Dominant Hand: Right    Extremity/Trunk Assessment   Upper Extremity Assessment Upper Extremity Assessment:  (R UE elevation, all planes, limited to approx 80 degrees (by pain), shoulder, elbow and wrist grossly WFL. Maintains in guarded position with limited active use throughout session.  L UE grossly WFL)    Lower Extremity Assessment Lower Extremity Assessment: Overall WFL for tasks assessed (grossly 4-/5 throughout; not tested with resistance secondary to reports of pain in R ankle)       Communication   Communication: No difficulties  Cognition Arousal/Alertness: Awake/alert Behavior During Therapy: WFL for tasks assessed/performed Overall Cognitive Status: Within Functional Limits for tasks assessed                                 General Comments: generally disinterested in therapy session and education offered by therapist      General Comments      Exercises Other Exercises Other Exercises: Toilet transfer, ambulatory with RW, cga/close sup-cuing for R UE placement/grasp on RW; sit/stand from standard height toilet, close sup; standing balance for hygiene and clothing management, close sup.  Able to indep negotiate clothing without assist from therapist.   Assessment/Plan    PT Assessment Patient needs continued PT services  PT Problem List Decreased strength;Decreased range of motion;Decreased activity tolerance;Decreased balance;Decreased mobility;Pain       PT Treatment Interventions DME instruction;Gait training;Stair training;Functional mobility training;Therapeutic activities;Therapeutic exercise;Balance training;Patient/family education    PT Goals (Current goals can be found in the Care Plan section)  Acute Rehab PT Goals Patient Stated Goal: to not make my shoulder and foot hurt anymore PT Goal Formulation: With patient Time For Goal Achievement: 01/10/17 Potential to Achieve Goals: Good    Frequency Min  2X/week   Barriers to discharge Decreased caregiver support      Co-evaluation               AM-PAC PT "6 Clicks" Daily Activity  Outcome Measure Difficulty turning over in bed (including adjusting bedclothes, sheets and blankets)?: None Difficulty moving from lying on back to sitting on the side of the bed? : None Difficulty sitting down on and standing up from a chair with arms (e.g., wheelchair, bedside commode, etc,.)?: A Little Help needed moving to and from a bed to chair (including a wheelchair)?: A Little Help needed walking in hospital room?: A Little Help needed climbing 3-5 steps with a railing? : A Little 6 Click Score: 20    End of Session Equipment Utilized During Treatment: Gait belt Activity Tolerance: Patient limited by pain Patient left: in bed;with call bell/phone within reach;with bed alarm set Nurse Communication: Mobility status PT Visit Diagnosis: Difficulty in walking, not elsewhere classified (R26.2);Pain Pain - Right/Left: Right Pain - part of body: Shoulder;Ankle and joints of foot    Time: 5449-2010 PT Time Calculation (min) (ACUTE ONLY): 16 min   Charges:   PT Evaluation $PT Eval Low Complexity: 1 Procedure PT Treatments $Therapeutic Activity: 8-22 mins   PT G Codes:   PT G-Codes **NOT FOR INPATIENT CLASS** Functional Assessment Tool Used: AM-PAC 6 Clicks Basic Mobility Functional Limitation: Mobility: Walking and moving around Mobility: Walking and Moving Around Current Status (O7121): At least 20 percent but less than 40 percent  impaired, limited or restricted Mobility: Walking and Moving Around Goal Status [phone]): At least 1 percent but less than 20 percent impaired, limited or restricted    Latresa Gasser H. Owens Shark, PT, DPT, NCS 12/27/16, 5:08 PM [phone]

## 2016-12-27 NOTE — H&P (Signed)
[facility] [place] NAME: Meredith Juarez    MR#:  [id number]  DATE OF BIRTH:  [birth year]  DATE OF ADMISSION:  12/26/2016  PRIMARY CARE PHYSICIAN: Patient, No Pcp Per   REQUESTING/REFERRING PHYSICIAN: Clearnce Hasten, MD  CHIEF COMPLAINT:   Chief Complaint  Patient presents with  . Cerebrovascular Accident  . Dizziness  . Weakness    HISTORY OF PRESENT ILLNESS:  Meredith Juarez  is a 56 y.o. female who presents with Right-sided weakness. Patient states that she woke up from a nap and had right sided weakness, as well as bilateral upper extremity tingling numbness, and bilateral lower extremity pain. Patient does have some mild grip strength weakness on evaluation in the ED in the right hand, hospitalists were called for admission and evaluation for possible stroke  PAST MEDICAL HISTORY:   Past Medical History:  Diagnosis Date  . Anemia   . Depression   . Diverticulitis   . GERD (gastroesophageal reflux disease)   . Hypertension   . Migraines   . Stroke ([place])   . Thyroid disease     PAST SURGICAL HISTORY:   Past Surgical History:  Procedure Laterality Date  . NO PAST SURGERIES      SOCIAL HISTORY:   Social History  Substance Use Topics  . Smoking status: Current Every Day Smoker    Packs/day: 0.25    Types: Cigarettes  . Smokeless tobacco: Never Used  . Alcohol use No    FAMILY HISTORY:   Family History  Problem Relation Age of Onset  . Cancer Mother        Uterine, Breast, Ovarian  . Hypertension Mother   . Hypertension Father   . Bipolar disorder Father   . CAD Sister   . Hyperlipidemia Sister     DRUG ALLERGIES:  No Known Allergies  MEDICATIONS AT HOME:   Prior to Admission medications   Medication Sig Start Date End Date Taking? Authorizing Provider  acetaminophen (TYLENOL) 500 MG tablet Take 500 mg by mouth every 4 (four) hours as needed.   Yes [provider]  amLODipine (NORVASC) 10 MG  tablet Take 1 tablet (10 mg total) by mouth daily. 11/10/16  Yes Pucilowska, Jolanta B, MD  aspirin EC 81 MG tablet Take 1 tablet (81 mg total) by mouth daily. 11/09/16  Yes Pucilowska, Jolanta B, MD  clopidogrel (PLAVIX) 75 MG tablet Take 1 tablet (75 mg total) by mouth daily. 11/09/16  Yes Pucilowska, Jolanta B, MD  hydrochlorothiazide (HYDRODIURIL) 25 MG tablet Take 1 tablet (25 mg total) by mouth daily. 11/09/16  Yes Pucilowska, Jolanta B, MD  ibuprofen (ADVIL,MOTRIN) 600 MG tablet Take 1 tablet (600 mg total) by mouth every 8 (eight) hours as needed. 11/11/16  Yes Darel Hong, MD  metoprolol tartrate (LOPRESSOR) 50 MG tablet Take 1 tablet (50 mg total) by mouth daily. 11/09/16 11/09/17 Yes Pucilowska, Jolanta B, MD  nitroGLYCERIN (NITROSTAT) 0.4 MG SL tablet Place 1 tablet (0.4 mg total) under the tongue every 5 (five) minutes as needed for chest pain. 11/09/16  Yes Pucilowska, Jolanta B, MD  potassium chloride SA (K-DUR,KLOR-CON) 20 MEQ tablet Take 1 tablet (20 mEq total) by mouth daily. 11/09/16  Yes Pucilowska, Jolanta B, MD  QUEtiapine (SEROQUEL) 200 MG tablet Take 1 tablet (200 mg total) by mouth at bedtime. 11/09/16  Yes Pucilowska, Jolanta B, MD  sertraline (ZOLOFT) 100 MG tablet Take 1 tablet (100 mg total) by mouth daily. 11/09/16  Yes Pucilowska,  Jolanta B, MD  traZODone (DESYREL) 100 MG tablet Take 1 tablet (100 mg total) by mouth at bedtime. 1/2 - 1 tablet as needed 11/09/16  Yes Pucilowska, Jolanta B, MD    REVIEW OF SYSTEMS:  Review of Systems  Constitutional: Negative for chills, fever, malaise/fatigue and weight loss.  HENT: Negative for ear pain, hearing loss and tinnitus.   Eyes: Negative for blurred vision, double vision, pain and redness.  Respiratory: Negative for cough, hemoptysis and shortness of breath.   Cardiovascular: Negative for chest pain, palpitations, orthopnea and leg swelling.  Gastrointestinal: Negative for abdominal pain, constipation, diarrhea, nausea and  vomiting.  Genitourinary: Negative for dysuria, frequency and hematuria.  Musculoskeletal: Negative for back pain, joint pain and neck pain.  Skin:       No acne, rash, or lesions  Neurological: Positive for sensory change and focal weakness. Negative for dizziness, tremors and weakness.  Endo/Heme/Allergies: Negative for polydipsia. Does not bruise/bleed easily.  Psychiatric/Behavioral: Negative for depression. The patient is not nervous/anxious and does not have insomnia.      VITAL SIGNS:   Vitals:   12/26/16 2302 12/26/16 2328 12/26/16 2330 12/26/16 2345  BP: (!) 143/105   118/81  Pulse: 75 64 66 66  Resp: 16 12 18 16   Temp: 98.4 F (36.9 C)     TempSrc: Oral     SpO2: 98% 97% 95% 98%  Weight:      Height:       Wt Readings from Last 3 Encounters:  12/26/16 80.7 kg (178 lb)  12/13/16 83 kg (183 lb)  11/11/16 80.3 kg (177 lb)    PHYSICAL EXAMINATION:  Physical Exam  Vitals reviewed. Constitutional: She is oriented to person, place, and time. She appears well-developed and well-nourished. No distress.  HENT:  Head: Normocephalic and atraumatic.  Mouth/Throat: Oropharynx is clear and moist.  Eyes: Conjunctivae and EOM are normal. Pupils are equal, round, and reactive to light. No scleral icterus.  Neck: Normal range of motion. Neck supple. No JVD present. No thyromegaly present.  Cardiovascular: Normal rate, regular rhythm and intact distal pulses.  Exam reveals no gallop and no friction rub.   No murmur heard. Respiratory: Effort normal and breath sounds normal. No respiratory distress. She has no wheezes. She has no rales.  GI: Soft. Bowel sounds are normal. She exhibits no distension. There is no tenderness.  Musculoskeletal: Normal range of motion. She exhibits no edema.  No arthritis, no gout  Lymphadenopathy:    She has no cervical adenopathy.  Neurological: She is alert and oriented to person, place, and time. No cranial nerve deficit.  Neurologic: Cranial  nerves II-XII intact, Sensation intact to light touch/pinprick, 5/5 strength in all extremities except for grip strength in her right upper extremity which is 3/5, no dysarthria, no aphasia, no dysphagia, memory intact  Skin: Skin is warm and dry. No rash noted. No erythema.  Psychiatric: She has a normal mood and affect. Her behavior is normal. Judgment and thought content normal.    LABORATORY PANEL:   CBC  Recent Labs Lab 12/26/16 2303  WBC 6.9  HGB 14.5  HCT 42.5  PLT 277   ------------------------------------------------------------------------------------------------------------------  Chemistries   Recent Labs Lab 12/26/16 2303  NA 137  K 2.7*  CL 101  CO2 28  GLUCOSE 100*  BUN 8  CREATININE 0.63  CALCIUM 8.7*  AST 30  ALT 15  ALKPHOS 62  BILITOT 1.0   ------------------------------------------------------------------------------------------------------------------  Cardiac Enzymes  Recent Labs Lab 12/26/16  [place] <0.03   ------------------------------------------------------------------------------------------------------------------  RADIOLOGY:  Ct Head Code Stroke W/o Cm  Result Date: 12/26/2016 CLINICAL DATA:  Code stroke.  56 y/o  F; right-sided weakness. EXAM: CT HEAD WITHOUT CONTRAST TECHNIQUE: Contiguous axial images were obtained from the base of the skull through the vertex without intravenous contrast. COMPARISON:  08/01/2016 CT head. FINDINGS: Brain: No evidence of acute infarction, hemorrhage, hydrocephalus, extra-axial collection or mass lesion/mass effect. Vascular: Mild calcific atherosclerosis of carotid bifurcations. Skull: Normal. Negative for fracture or focal lesion. Sinuses/Orbits: No acute finding. Other: None. ASPECTS [facility] Stroke Program Early CT Score) - Ganglionic level infarction (caudate, lentiform nuclei, internal capsule, insula, M1-M3 cortex): 7 - Supraganglionic infarction (M4-M6 cortex): 3 Total score (0-10 with 10  being normal): 10 IMPRESSION: 1. No acute intracranial abnormality identified. Unremarkable CT of head. 2. ASPECTS is 10 These results were called by telephone at the time of interpretation on 12/26/2016 at 11:26 pm to Dr. Larae Grooms , who verbally acknowledged these results. Electronically Signed   By: Kristine Garbe M.D.   On: 12/26/2016 23:27    EKG:   Orders placed or performed during the hospital encounter of 12/26/16  . EKG 12-Lead  . EKG 12-Lead  . ED EKG  . ED EKG    IMPRESSION AND PLAN:  Principal Problem:   Right sided weakness - unclear etiology at this time, potentially due to stroke, admitted per stroke admission orders set with corresponding imaging, labs, consults including neurology consult Active Problems:   H/O: stroke - continue home meds, other workup as above   Hypertension - would hold antihypertensives tonight, though the patient took them before she came in.   Depression - continue home meds  All the records are reviewed and case discussed with ED provider. Management plans discussed with the patient and/or family.  DVT PROPHYLAXIS: SubQ lovenox  GI PROPHYLAXIS: None  ADMISSION STATUS: Observation  CODE STATUS: Full Code Status History    Date Active Date Inactive Code Status Order ID Comments User Context   11/06/2016  4:38 PM 11/10/2016  4:06 PM Full Code [id number]  Lenward Chancellor, MD Inpatient      TOTAL TIME TAKING CARE OF THIS PATIENT: 40 minutes.   Floyce Bujak Bertha 12/27/2016, 12:18 AM  [facility]  Office  [phone]  CC: Primary care physician; Patient, No Pcp Per  Note:  This document was prepared using Dragon voice recognition software and may include unintentional dictation errors.

## 2016-12-27 NOTE — Progress Notes (Signed)
PT Cancellation Note  Patient Details Name: Meredith Juarez MRN: [id number] DOB: May 12, 1961   Cancelled Treatment:    Reason Eval/Treat Not Completed: Medical issues which prohibited therapy (Consult received and chart reviewed.  CTH negative for acute change/injury; now pending R shoulder and foot x-ray to rule out bony abnormality.  Will hold PT evaluation until tests complete and results received.  Will follow and initiate as appropriate.)   Ivannah Zody H. Owens Shark, PT, DPT, NCS 12/27/16, 11:07 AM [phone]

## 2016-12-27 NOTE — Plan of Care (Signed)
Problem: Education: Goal: Knowledge of disease or condition will improve Outcome: Progressing Mri  Negative/ xray of  Rt shoulder  Showed degenerative change/  Rt foot xray neg.  Waiting for p.t to eval pt.

## 2016-12-28 DIAGNOSIS — M19079 Primary osteoarthritis, unspecified ankle and foot: Secondary | ICD-10-CM | POA: Diagnosis present

## 2016-12-28 DIAGNOSIS — M1990 Unspecified osteoarthritis, unspecified site: Secondary | ICD-10-CM | POA: Diagnosis present

## 2016-12-28 DIAGNOSIS — M19019 Primary osteoarthritis, unspecified shoulder: Secondary | ICD-10-CM | POA: Diagnosis present

## 2016-12-28 LAB — HEMOGLOBIN A1C
HEMOGLOBIN A1C: 5.7 % — AB (ref 4.8–5.6)
Mean Plasma Glucose: 117 mg/dL

## 2016-12-28 LAB — HIV ANTIBODY (ROUTINE TESTING W REFLEX): HIV Screen 4th Generation wRfx: NONREACTIVE

## 2016-12-28 LAB — POTASSIUM: Potassium: 3.4 mmol/L — ABNORMAL LOW (ref 3.5–5.1)

## 2016-12-28 NOTE — Care Management (Signed)
Admitted to [facility] under observation status with the diagnosis of right sided weakness. Lives with friend [name]. Sister is [name] [phone]). [facility] and [facility] in the past. Spoke with [name], [facility] representative. States that [name] would need to hand in noterized papers about financial means. States she did hand in these papers June 18th. [name] updated.  Physical therapy evaluation completed. Recommending outpatient therapy. [facility] about [facility] given. Will get rolling walker from [facility]  States she has no transportation home. Will issue taxi voucher.  Encouraged to go to [facility] for discharge medications. Discharge to home today per Dr. Oleh Genin RN MSN [facility] [phone]

## 2016-12-28 NOTE — Consult Note (Signed)
Reason for Consult: R sided numbness  Referring Physician: Dr. Darvin Neighbours  CC: R sided numbness   HPI: Meredith Juarez is an 56 y.o. female who presents with RUE numbness and weakness.  Patient states that she woke up from a nap and had right sided weakness, as well as bilateral upper extremity tingling numbness, and bilateral lower extremity pain. Currently back to baseline. Pt complains of RUE weakness but it appears it is related to pain coming out of R shoulder.   Past Medical History:  Diagnosis Date  . Anemia   . Depression   . Diverticulitis   . GERD (gastroesophageal reflux disease)   . Hypertension   . Migraines   . Stroke ([place])   . Thyroid disease     Past Surgical History:  Procedure Laterality Date  . NO PAST SURGERIES      Family History  Problem Relation Age of Onset  . Cancer Mother        Uterine, Breast, Ovarian  . Hypertension Mother   . Hypertension Father   . Bipolar disorder Father   . CAD Sister   . Hyperlipidemia Sister     Social History:  reports that she has been smoking Cigarettes.  She has been smoking about 0.25 packs per day. She has never used smokeless tobacco. She reports that she drinks alcohol. She reports that she does not use drugs.  No Known Allergies  Medications: I have reviewed the patient's current medications.  ROS: History obtained from the patient  General ROS: negative for - chills, fatigue, fever, night sweats, weight gain or weight loss Psychological ROS: negative for - behavioral disorder, hallucinations, memory difficulties, mood swings or suicidal ideation Ophthalmic ROS: negative for - blurry vision, double vision, eye pain or loss of vision ENT ROS: negative for - epistaxis, nasal discharge, oral lesions, sore throat, tinnitus or vertigo Allergy and Immunology ROS: negative for - hives or itchy/watery eyes Hematological and Lymphatic ROS: negative for - bleeding problems, bruising or swollen lymph nodes Endocrine ROS:  negative for - galactorrhea, hair pattern changes, polydipsia/polyuria or temperature intolerance Respiratory ROS: negative for - cough, hemoptysis, shortness of breath or wheezing Cardiovascular ROS: negative for - chest pain, dyspnea on exertion, edema or irregular heartbeat Gastrointestinal ROS: negative for - abdominal pain, diarrhea, hematemesis, nausea/vomiting or stool incontinence Genito-Urinary ROS: negative for - dysuria, hematuria, incontinence or urinary frequency/urgency Musculoskeletal ROS: negative for - joint swelling or muscular weakness Neurological ROS: as noted in HPI Dermatological ROS: negative for rash and skin lesion changes  Physical Examination: Blood pressure 114/78, pulse 71, temperature 97.6 F (36.4 C), temperature source Oral, resp. rate 16, height 5\' 6"  (1.676 m), weight 82.2 kg (181 lb 4.8 oz), SpO2 95 %.    Neurological Examination   Mental Status: Alert, oriented, thought content appropriate.  Speech fluent without evidence of aphasia.  Able to follow 3 step commands without difficulty. Cranial Nerves: II: Discs flat bilaterally; Visual fields grossly normal, pupils equal, round, reactive to light and accommodation III,IV, VI: ptosis not present, extra-ocular motions intact bilaterally V,VII: smile symmetric, facial light touch sensation normal bilaterally VIII: hearing normal bilaterally IX,X: gag reflex present XI: bilateral shoulder shrug XII: midline tongue extension Motor: Right : Upper extremity   5/5   Left:     Upper extremity   5/5  Lower extremity   5/5     Lower extremity   5/5 Tone and bulk:normal tone throughout; no atrophy noted Sensory: Pinprick and light touch intact throughout,  bilaterally Deep Tendon Reflexes: 1+ and symmetric throughout Plantars: Right: downgoing   Left: downgoing Cerebellar: normal finger-to-nose, normal rapid alternating movements and normal heel-to-shin test Gait: not tested      Laboratory Studies:    Basic Metabolic Panel:  Recent Labs Lab 12/26/16 2303 12/27/16 0514  NA 137  --   K 2.7*  --   CL 101  --   CO2 28  --   GLUCOSE 100*  --   BUN 8  --   CREATININE 0.63  --   CALCIUM 8.7*  --   MG  --  1.8    Liver Function Tests:  Recent Labs Lab 12/26/16 2303  AST 30  ALT 15  ALKPHOS 62  BILITOT 1.0  PROT 5.9*  ALBUMIN 3.4*   No results for input(s): LIPASE, AMYLASE in the last 168 hours. No results for input(s): AMMONIA in the last 168 hours.  CBC:  Recent Labs Lab 12/26/16 2303  WBC 6.9  NEUTROABS 4.6  HGB 14.5  HCT 42.5  MCV 80.2  PLT 277    Cardiac Enzymes:  Recent Labs Lab 12/26/16 2303  TROPONINI <0.03    BNP: Invalid input(s): POCBNP  CBG: No results for input(s): GLUCAP in the last 168 hours.  Microbiology: Results for orders placed or performed during the hospital encounter of 08/01/16  Urine culture     Status: Abnormal   Collection Time: 08/01/16  4:43 PM  Result Value Ref Range Status   Specimen Description URINE, RANDOM  Final   Special Requests NONE  Final   Culture (A)  Final    <10,000 COLONIES/mL INSIGNIFICANT GROWTH Performed at [facility], [place] [address] [place], [place] [address]    Report Status 08/03/2016 FINAL  Final    Coagulation Studies:  Recent Labs  12/26/16 2303  LABPROT 12.6  INR 0.94    Urinalysis: No results for input(s): COLORURINE, LABSPEC, PHURINE, GLUCOSEU, HGBUR, BILIRUBINUR, KETONESUR, PROTEINUR, UROBILINOGEN, NITRITE, LEUKOCYTESUR in the last 168 hours.  Invalid input(s): APPERANCEUR  Lipid Panel:     Component Value Date/Time   CHOL 171 12/27/2016 0514   CHOL 157 05/08/2013 0531   TRIG 400 (H) 12/27/2016 0514   TRIG 246 (H) 05/08/2013 0531   HDL 43 12/27/2016 0514   HDL 43 05/08/2013 0531   CHOLHDL 4.0 12/27/2016 0514   VLDL 80 (H) 12/27/2016 0514   VLDL 49 (H) 05/08/2013 0531   LDLCALC 48 12/27/2016 0514   LDLCALC 65 05/08/2013 0531    HgbA1C:  Lab Results   Component Value Date   HGBA1C 5.7 (H) 12/27/2016    Urine Drug Screen:      Component Value Date/Time   LABOPIA NONE DETECTED 11/06/2016 0430   COCAINSCRNUR NONE DETECTED 11/06/2016 0430   LABBENZ NONE DETECTED 11/06/2016 0430   AMPHETMU NONE DETECTED 11/06/2016 0430   THCU NONE DETECTED 11/06/2016 0430   LABBARB NONE DETECTED 11/06/2016 0430    Alcohol Level: No results for input(s): ETH in the last 168 hours.  Other results: EKG: normal EKG, normal sinus rhythm, unchanged from previous tracings.  Imaging: Dg Shoulder Right  Result Date: 12/27/2016 CLINICAL DATA:  Pain without trauma. EXAM: RIGHT SHOULDER - 2+ VIEW COMPARISON:  08/01/2016 FINDINGS: Mild degenerative irregularity of the undersurface of the acromion. No acute fracture or dislocation. Visualized portion of the right hemithorax is normal. IMPRESSION: Degenerative change, without acute osseous finding. Electronically Signed   By: Abigail Miyamoto M.D.   On: 12/27/2016 13:08  Mr Jodene Nam Neck W Wo Contrast  Result Date: 12/27/2016 CLINICAL DATA:  Patient woke up from now with right-sided weakness. Bilateral upper extremity tingling and numbness. Bilateral lower extremity pain. History of migraines and alcohol abuse. EXAM: MR HEAD WITHOUT CONTRAST MR CIRCLE OF WILLIS WITHOUT CONTRAST MRA OF THE NECK WITHOUT AND WITH CONTRAST TECHNIQUE: Multiplanar, multiecho pulse sequences of the brain, circle of willis and surrounding structures were obtained without intravenous contrast. Angiographic images of the neck were obtained using MRA technique without and with intravenous contrast. CONTRAST:  15 cc MultiHance intravenous COMPARISON:  Intracranial MRA 08/01/2016.  Brain MRI 05/07/2013 FINDINGS: MR HEAD FINDINGS Brain: No acute infarction, hemorrhage, hydrocephalus, extra-axial collection or mass lesion. Normal brain volume. Minimal nonspecific FLAIR hyperintensity in the deep cerebral white matter. No specific demyelinating pattern. Vascular:  Normal flow voids. Skull and upper cervical spine: Negative for marrow lesion Sinuses/Orbits: Mucous retention cyst in the inferior left maxillary sinus. Negative orbits MR CIRCLE OF WILLIS FINDINGS Symmetric carotid and vertebral arteries. Artifactually diminished signal distal M2 and bilateral V4 segments. No major branch occlusion, beading, flow limiting stenosis, or aneurysm. MRA NECK FINDINGS Antegrade flow in both carotid and vertebral arteries on time-of-flight acquisition. Normal diameter of the visualized aortic arch. Three vessel arch branching. Mild tortuosity of both carotid circulations without stenosis, ulceration, or beading. No proximal subclavian stenosis. Codominant vertebral arteries. Vertebral artery tortuosity without stenosis or beading. IMPRESSION: 1. No acute finding including infarct.  No explanation for symptoms. 2. Negative intracranial and cervical MRA. Electronically Signed   By: Monte Fantasia M.D.   On: 12/27/2016 13:18   Mr Brain Wo Contrast  Result Date: 12/27/2016 CLINICAL DATA:  Patient woke up from now with right-sided weakness. Bilateral upper extremity tingling and numbness. Bilateral lower extremity pain. History of migraines and alcohol abuse. EXAM: MR HEAD WITHOUT CONTRAST MR CIRCLE OF WILLIS WITHOUT CONTRAST MRA OF THE NECK WITHOUT AND WITH CONTRAST TECHNIQUE: Multiplanar, multiecho pulse sequences of the brain, circle of willis and surrounding structures were obtained without intravenous contrast. Angiographic images of the neck were obtained using MRA technique without and with intravenous contrast. CONTRAST:  15 cc MultiHance intravenous COMPARISON:  Intracranial MRA 08/01/2016.  Brain MRI 05/07/2013 FINDINGS: MR HEAD FINDINGS Brain: No acute infarction, hemorrhage, hydrocephalus, extra-axial collection or mass lesion. Normal brain volume. Minimal nonspecific FLAIR hyperintensity in the deep cerebral white matter. No specific demyelinating pattern. Vascular: Normal  flow voids. Skull and upper cervical spine: Negative for marrow lesion Sinuses/Orbits: Mucous retention cyst in the inferior left maxillary sinus. Negative orbits MR CIRCLE OF WILLIS FINDINGS Symmetric carotid and vertebral arteries. Artifactually diminished signal distal M2 and bilateral V4 segments. No major branch occlusion, beading, flow limiting stenosis, or aneurysm. MRA NECK FINDINGS Antegrade flow in both carotid and vertebral arteries on time-of-flight acquisition. Normal diameter of the visualized aortic arch. Three vessel arch branching. Mild tortuosity of both carotid circulations without stenosis, ulceration, or beading. No proximal subclavian stenosis. Codominant vertebral arteries. Vertebral artery tortuosity without stenosis or beading. IMPRESSION: 1. No acute finding including infarct.  No explanation for symptoms. 2. Negative intracranial and cervical MRA. Electronically Signed   By: Monte Fantasia M.D.   On: 12/27/2016 13:18   Dg Foot Complete Right  Result Date: 12/27/2016 CLINICAL DATA:  Chronic right foot pain. EXAM: RIGHT FOOT COMPLETE - 3+ VIEW COMPARISON:  None. FINDINGS: There is no evidence of fracture or dislocation. There is no evidence of arthropathy or other focal bone abnormality. Soft tissues are unremarkable. IMPRESSION:  Negative. Electronically Signed   By: Fidela Salisbury M.D.   On: 12/27/2016 13:57   Mr Jodene Nam Head/brain ZO Cm  Result Date: 12/27/2016 CLINICAL DATA:  Patient woke up from now with right-sided weakness. Bilateral upper extremity tingling and numbness. Bilateral lower extremity pain. History of migraines and alcohol abuse. EXAM: MR HEAD WITHOUT CONTRAST MR CIRCLE OF WILLIS WITHOUT CONTRAST MRA OF THE NECK WITHOUT AND WITH CONTRAST TECHNIQUE: Multiplanar, multiecho pulse sequences of the brain, circle of willis and surrounding structures were obtained without intravenous contrast. Angiographic images of the neck were obtained using MRA technique without and  with intravenous contrast. CONTRAST:  15 cc MultiHance intravenous COMPARISON:  Intracranial MRA 08/01/2016.  Brain MRI 05/07/2013 FINDINGS: MR HEAD FINDINGS Brain: No acute infarction, hemorrhage, hydrocephalus, extra-axial collection or mass lesion. Normal brain volume. Minimal nonspecific FLAIR hyperintensity in the deep cerebral white matter. No specific demyelinating pattern. Vascular: Normal flow voids. Skull and upper cervical spine: Negative for marrow lesion Sinuses/Orbits: Mucous retention cyst in the inferior left maxillary sinus. Negative orbits MR CIRCLE OF WILLIS FINDINGS Symmetric carotid and vertebral arteries. Artifactually diminished signal distal M2 and bilateral V4 segments. No major branch occlusion, beading, flow limiting stenosis, or aneurysm. MRA NECK FINDINGS Antegrade flow in both carotid and vertebral arteries on time-of-flight acquisition. Normal diameter of the visualized aortic arch. Three vessel arch branching. Mild tortuosity of both carotid circulations without stenosis, ulceration, or beading. No proximal subclavian stenosis. Codominant vertebral arteries. Vertebral artery tortuosity without stenosis or beading. IMPRESSION: 1. No acute finding including infarct.  No explanation for symptoms. 2. Negative intracranial and cervical MRA. Electronically Signed   By: Monte Fantasia M.D.   On: 12/27/2016 13:18   Ct Head Code Stroke W/o Cm  Result Date: 12/26/2016 CLINICAL DATA:  Code stroke.  56 y/o  F; right-sided weakness. EXAM: CT HEAD WITHOUT CONTRAST TECHNIQUE: Contiguous axial images were obtained from the base of the skull through the vertex without intravenous contrast. COMPARISON:  08/01/2016 CT head. FINDINGS: Brain: No evidence of acute infarction, hemorrhage, hydrocephalus, extra-axial collection or mass lesion/mass effect. Vascular: Mild calcific atherosclerosis of carotid bifurcations. Skull: Normal. Negative for fracture or focal lesion. Sinuses/Orbits: No acute finding.  Other: None. ASPECTS [facility] Stroke Program Early CT Score) - Ganglionic level infarction (caudate, lentiform nuclei, internal capsule, insula, M1-M3 cortex): 7 - Supraganglionic infarction (M4-M6 cortex): 3 Total score (0-10 with 10 being normal): 10 IMPRESSION: 1. No acute intracranial abnormality identified. Unremarkable CT of head. 2. ASPECTS is 10 These results were called by telephone at the time of interpretation on 12/26/2016 at 11:26 pm to Dr. Larae Grooms , who verbally acknowledged these results. Electronically Signed   By: Kristine Garbe M.D.   On: 12/26/2016 23:27     Assessment/Plan:  56 y.o. female who presents with RUE numbness and weakness.  Patient states that she woke up from a nap and had right sided weakness, as well as bilateral upper extremity tingling numbness, and bilateral lower extremity pain. Currently back to baseline. Pt complains of RUE weakness but it appears it is related to pain coming out of R shoulder.   Imaging didn't show any abnormalities.  Symptoms are likely related to arthritis and joint disease D/c planning today    12/28/2016, 10:28 AM

## 2016-12-28 NOTE — Evaluation (Signed)
Occupational Therapy Evaluation Patient Details Name: Meredith Juarez MRN: [id number] DOB: 1961/05/02 Today's Date: 12/28/2016    History of Present Illness Pt. is a 56 y.o. female who was admitted to [facility] with right sided weakness, right shoulder pain, and foot pain. Imaging negative for acute infarct. X-rays negative for aacute osseous injury.   Clinical Impression   Pt. Education was provided about compensatory strategies, and work simplification techniques. Pt. Reports not being interested in compensating with her LUE, or using her LUE during ADLs, and to complete daily care tasks. Pt. Refused to have any part of her RUE assessed secondary to not wanting her right shoulder to start hurting. Pt. reports working out ways of doing things at home to compensate for her RUE pain. Pt. Has assistance at home. No further OT services are warranted at this time.      Follow Up Recommendations  No OT follow up    Equipment Recommendations       Recommendations for Other Services       Precautions / Restrictions Precautions Precautions: Fall Restrictions Weight Bearing Restrictions: No             ADL either performed or assessed with clinical judgement   ADL Overall ADL's : Needs assistance/impaired Eating/Feeding: Set up   Grooming: Set up   Upper Body Bathing: Set up   Lower Body Bathing: Minimal assistance   Upper Body Dressing : Minimal assistance   Lower Body Dressing: Minimal assistance                 General ADL Comments: Pt. uses her left hand to complete ADL tasks. Pt. reports not being interested in learning any compensatory strategies using her left UE for ADLs.     Vision         Perception     Praxis      Pertinent Vitals/Pain Pain Assessment: 0-10 Pain Score: 7  Pain Location: Right shoulder Pain Descriptors / Indicators: Aching;Guarding Pain Intervention(s): Limited activity within patient's tolerance;Monitored during session;Patient  requesting pain meds-RN notified     Hand Dominance Right   Extremity/Trunk Assessment Upper Extremity Assessment Upper Extremity Assessment: RUE deficits/detail RUE Deficits / Details: Pt. refused RUE assessment RUE: Unable to fully assess due to pain           Communication Communication Communication: No difficulties   Cognition Arousal/Alertness: Awake/alert Behavior During Therapy: WFL for tasks assessed/performed Overall Cognitive Status: Within Functional Limits for tasks assessed                                     General Comments       Exercises     Shoulder Instructions      Home Living Family/patient expects to be discharged to:: Private residence Living Arrangements: Non-relatives/Friends Available Help at Discharge: Family;Available 24 hours/day Type of Home: House Home Access: Stairs to enter [employer] of Steps: 2 Entrance Stairs-Rails: None Home Layout: Two level;Able to live on main level with bedroom/bathroom Alternate Level Stairs-Number of Steps: 15 Alternate Level Stairs-Rails: Left           Home Equipment: Cane - single point          Prior Functioning/Environment Level of Independence: Independent with assistive device(s)        Comments: Independent with ADls, and IADLs, disability pending.        OT Problem List: Decreased strength;Decreased coordination;Pain;Decreased  activity tolerance;Impaired UE functional use;Decreased cognition;Decreased safety awareness;Decreased range of motion      OT Treatment/Interventions:      OT Goals(Current goals can be found in the care plan section)    OT Frequency:     Barriers to D/C:            Co-evaluation              AM-PAC PT "6 Clicks" Daily Activity     Outcome Measure Help from another person eating meals?: A Little   Help from another person toileting, which includes using toliet, bedpan, or urinal?: A Little Help from another  person bathing (including washing, rinsing, drying)?: A Little Help from another person to put on and taking off regular upper body clothing?: A Little Help from another person to put on and taking off regular lower body clothing?: A Little 6 Click Score: 15   End of Session    Activity Tolerance: Patient limited by pain Patient left: in bed;with call bell/phone within reach  OT Visit Diagnosis: Muscle weakness (generalized) (M62.81);Pain Pain - Right/Left: Right Pain - part of body: Shoulder;Ankle and joints of foot                Time: 1055-1110 OT Time Calculation (min): 15 min Charges:  OT General Charges $OT Visit: 1 Procedure OT Evaluation $OT Eval Low Complexity: 1 Procedure G-Codes: OT G-codes **NOT FOR INPATIENT CLASS** Functional Assessment Tool Used: AM-PAC 6 Clicks Daily Activity;Clinical judgement Functional Limitation: Self care Self Care Current Status (G6440): At least 20 percent but less than 40 percent impaired, limited or restricted Self Care Goal Status (H4742): At least 1 percent but less than 20 percent impaired, limited or restricted  Harrel Carina, MS, OTR/L   Harrel Carina, MS, OTR/L 12/28/2016, 11:41 AM

## 2016-12-28 NOTE — Progress Notes (Signed)
Pt being discharged home, discharge instructions and prescriptions reviewed with pt, states understanding, pt to be given a walker for discharge, taxi voucher for transport home, pt with no complaints

## 2016-12-28 NOTE — Plan of Care (Signed)
Problem: Education: Goal: Knowledge of [facility] Education information/materials will improve Outcome: Progressing VSS, free of falls during shift.  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  Unable to complete full neuro assessment/NIH d/t R shoulder or BLE pain/discomfort.  No other needs overnight.  Bed in low position, call bell within reach.  WCTM.

## 2016-12-30 ENCOUNTER — Telehealth: Payer: Self-pay | Admitting: Pharmacy Technician

## 2016-12-30 NOTE — Telephone Encounter (Signed)
Patient eligible to receive medication assistance at [facility] until 09/26/17 as long as eligibility requirements continue to be met.  Betty J. Kluttz Care Manager [facility] 

## 2016-12-30 NOTE — Discharge Summary (Signed)
[facility] [place] NAME: Meredith Juarez    MR#:  [id number]  DATE OF BIRTH:  1961/06/21  DATE OF ADMISSION:  12/26/2016 ADMITTING PHYSICIAN: Lance Coon, MD  DATE OF DISCHARGE: 12/28/2016  3:29 PM  PRIMARY CARE PHYSICIAN: Patient, No Pcp Per   ADMISSION DIAGNOSIS:  Hypokalemia [E87.6] Numbness [R20.0] Right sided weakness [R53.1]  DISCHARGE DIAGNOSIS:  Principal Problem:   Right sided weakness Active Problems:   Hypertension   H/O: stroke   Depression   Arthritis of ankle   Shoulder arthritis   SECONDARY DIAGNOSIS:   Past Medical History:  Diagnosis Date  . Anemia   . Depression   . Diverticulitis   . GERD (gastroesophageal reflux disease)   . Hypertension   . Migraines   . Stroke ([place])   . Thyroid disease      ADMITTING HISTORY  HISTORY OF PRESENT ILLNESS:  Meredith Juarez  is a 56 y.o. female who presents with Right-sided weakness. Patient states that she woke up from a nap and had right sided weakness, as well as bilateral upper extremity tingling numbness, and bilateral lower extremity pain. Patient does have some mild grip strength weakness on evaluation in the ED in the right hand, hospitalists were called for admission and evaluation for possible stroke   HOSPITAL COURSE:   * Right upper extremity weakness is very mild in subjective. Likely from arthritis in the shoulder. Seen by neurology Dr. Irish Elders. Patient had MRI of the brain which showed no strokes. X-ray of the right shoulder showed mild degenerative disease. Seen by physical therapy and occupational therapy. Patient provided with a rolling walker at discharge. Patient is on appropriate medications with aspirin, Plavix, statin. Not stroke. Patient has been counseled to be compliant with medications and follow-up with her primary care physician. Stable for discharge home.  CONSULTS OBTAINED:  Treatment Team:  Catarina Hartshorn, MD Leotis Pain,  MD  DRUG ALLERGIES:  No Known Allergies  DISCHARGE MEDICATIONS:   Discharge Medication List as of 12/28/2016  9:58 AM    START taking these medications   Details  atorvastatin (LIPITOR) 40 MG tablet Take 1 tablet (40 mg total) by mouth daily., Starting Mon 12/27/2016, Until Tue 12/27/2017, Normal      CONTINUE these medications which have NOT CHANGED   Details  acetaminophen (TYLENOL) 500 MG tablet Take 500 mg by mouth every 4 (four) hours as needed., Historical Med    amLODipine (NORVASC) 10 MG tablet Take 1 tablet (10 mg total) by mouth daily., Starting Wed 11/10/2016, Print    aspirin EC 81 MG tablet Take 1 tablet (81 mg total) by mouth daily., Starting Tue 11/09/2016, Print    clopidogrel (PLAVIX) 75 MG tablet Take 1 tablet (75 mg total) by mouth daily., Starting Tue 11/09/2016, Print    hydrochlorothiazide (HYDRODIURIL) 25 MG tablet Take 1 tablet (25 mg total) by mouth daily., Starting Tue 11/09/2016, Print    ibuprofen (ADVIL,MOTRIN) 600 MG tablet Take 1 tablet (600 mg total) by mouth every 8 (eight) hours as needed., Starting Thu 11/11/2016, Print    metoprolol tartrate (LOPRESSOR) 50 MG tablet Take 1 tablet (50 mg total) by mouth daily., Starting Tue 11/09/2016, Until Wed 11/09/2017, Print    nitroGLYCERIN (NITROSTAT) 0.4 MG SL tablet Place 1 tablet (0.4 mg total) under the tongue every 5 (five) minutes as needed for chest pain., Starting Tue 11/09/2016, Print    potassium chloride SA (K-DUR,KLOR-CON) 20 MEQ tablet Take 1 tablet (20 mEq  total) by mouth daily., Starting Tue 11/09/2016, Print    QUEtiapine (SEROQUEL) 200 MG tablet Take 1 tablet (200 mg total) by mouth at bedtime., Starting Tue 11/09/2016, Print    sertraline (ZOLOFT) 100 MG tablet Take 1 tablet (100 mg total) by mouth daily., Starting Tue 11/09/2016, Print    traZODone (DESYREL) 100 MG tablet Take 1 tablet (100 mg total) by mouth at bedtime. 1/2 - 1 tablet as needed, Starting Tue 11/09/2016, Print        Today    VITAL SIGNS:  Blood pressure 114/78, pulse 71, temperature 97.6 F (36.4 C), temperature source Oral, resp. rate 16, height 5\' 6"  (1.676 m), weight 82.2 kg (181 lb 4.8 oz), SpO2 95 %.  I/O:  No intake or output data in the 24 hours ending 12/30/16 1217  PHYSICAL EXAMINATION:  Physical Exam  GENERAL:  56 y.o.-year-old patient lying in the bed with no acute distress.  LUNGS: Normal breath sounds bilaterally, no wheezing, rales,rhonchi or crepitation. No use of accessory muscles of respiration.  CARDIOVASCULAR: S1, S2 normal. No murmurs, rubs, or gallops.  ABDOMEN: Soft, non-tender, non-distended. Bowel sounds present. No organomegaly or mass.  NEUROLOGIC: Moves all 4 extremities. PSYCHIATRIC: The patient is alert and oriented x 3.  SKIN: No obvious rash, lesion, or ulcer.   DATA REVIEW:   CBC  Recent Labs Lab 12/26/16 2303  WBC 6.9  HGB 14.5  HCT 42.5  PLT 277    Chemistries   Recent Labs Lab 12/26/16 2303 12/27/16 0514 12/28/16 1322  NA 137  --   --   K 2.7*  --  3.4*  CL 101  --   --   CO2 28  --   --   GLUCOSE 100*  --   --   BUN 8  --   --   CREATININE 0.63  --   --   CALCIUM 8.7*  --   --   MG  --  1.8  --   AST 30  --   --   ALT 15  --   --   ALKPHOS 62  --   --   BILITOT 1.0  --   --     Cardiac Enzymes  Recent Labs Lab 12/26/16 2303  TROPONINI <0.03    Microbiology Results  Results for orders placed or performed during the hospital encounter of 08/01/16  Urine culture     Status: Abnormal   Collection Time: 08/01/16  4:43 PM  Result Value Ref Range Status   Specimen Description URINE, RANDOM  Final   Special Requests NONE  Final   Culture (A)  Final    <10,000 COLONIES/mL INSIGNIFICANT GROWTH Performed at [facility], [place] [address] [place], [place] [address]    Report Status 08/03/2016 FINAL  Final    RADIOLOGY:  No results found.  Follow up with PCP in 1 week.  Management plans discussed with the patient, family and  they are in agreement.  CODE STATUS:  Code Status History    Date Active Date Inactive Code Status Order ID Comments User Context   12/27/2016  1:35 AM 12/28/2016  6:35 PM Full Code [id number]  Lance Coon, MD ED   11/06/2016  4:38 PM 11/10/2016  4:06 PM Full Code [id number]  Lenward Chancellor, MD Inpatient      TOTAL TIME TAKING CARE OF THIS PATIENT ON DAY OF DISCHARGE: more than 30 minutes.   Hillary Bow R M.D on 12/30/2016 at 12:17 PM  Between 7am to 6pm - Pager - [phone]  After 6pm go to [email or web address] - password [email or web address] [facility]  Office  [phone]  CC: Primary care physician; Patient, No Pcp Per  Note: This dictation was prepared with Dragon dictation along with smaller phrase technology. Any transcriptional errors that result from this process are unintentional.

## 2017-01-17 ENCOUNTER — Emergency Department
Admission: EM | Admit: 2017-01-17 | Discharge: 2017-01-17 | Disposition: A | Discharge status: Home / Self Care | Payer: Self-pay | Attending: Emergency Medicine | Admitting: Emergency Medicine

## 2017-01-17 ENCOUNTER — Emergency Department: Payer: Self-pay

## 2017-01-17 ENCOUNTER — Encounter: Payer: Self-pay | Admitting: Emergency Medicine

## 2017-01-17 DIAGNOSIS — Z7982 Long term (current) use of aspirin: Secondary | ICD-10-CM | POA: Insufficient documentation

## 2017-01-17 DIAGNOSIS — E876 Hypokalemia: Secondary | ICD-10-CM | POA: Insufficient documentation

## 2017-01-17 DIAGNOSIS — I10 Essential (primary) hypertension: Secondary | ICD-10-CM | POA: Insufficient documentation

## 2017-01-17 DIAGNOSIS — K5792 Diverticulitis of intestine, part unspecified, without perforation or abscess without bleeding: Secondary | ICD-10-CM | POA: Insufficient documentation

## 2017-01-17 DIAGNOSIS — Z79899 Other long term (current) drug therapy: Secondary | ICD-10-CM | POA: Insufficient documentation

## 2017-01-17 DIAGNOSIS — K219 Gastro-esophageal reflux disease without esophagitis: Secondary | ICD-10-CM | POA: Insufficient documentation

## 2017-01-17 DIAGNOSIS — K5900 Constipation, unspecified: Secondary | ICD-10-CM | POA: Insufficient documentation

## 2017-01-17 DIAGNOSIS — Z823 Family history of stroke: Secondary | ICD-10-CM | POA: Insufficient documentation

## 2017-01-17 DIAGNOSIS — F17210 Nicotine dependence, cigarettes, uncomplicated: Secondary | ICD-10-CM | POA: Insufficient documentation

## 2017-01-17 DIAGNOSIS — K567 Ileus, unspecified: Secondary | ICD-10-CM | POA: Insufficient documentation

## 2017-01-17 LAB — CBC
HEMATOCRIT: 45.5 % (ref 35.0–47.0)
Hemoglobin: 15.8 g/dL (ref 12.0–16.0)
MCH: 28.1 pg (ref 26.0–34.0)
MCHC: 34.6 g/dL (ref 32.0–36.0)
MCV: 81.3 fL (ref 80.0–100.0)
PLATELETS: 320 10*3/uL (ref 150–440)
RBC: 5.6 MIL/uL — AB (ref 3.80–5.20)
RDW: 13.8 % (ref 11.5–14.5)
WBC: 8.2 10*3/uL (ref 3.6–11.0)

## 2017-01-17 LAB — COMPREHENSIVE METABOLIC PANEL
ALT: 12 U/L — ABNORMAL LOW (ref 14–54)
AST: 20 U/L (ref 15–41)
Albumin: 3.8 g/dL (ref 3.5–5.0)
Alkaline Phosphatase: 48 U/L (ref 38–126)
Anion gap: 10 (ref 5–15)
BILIRUBIN TOTAL: 1.4 mg/dL — AB (ref 0.3–1.2)
BUN: 9 mg/dL (ref 6–20)
CHLORIDE: 106 mmol/L (ref 101–111)
CO2: 26 mmol/L (ref 22–32)
CREATININE: 0.85 mg/dL (ref 0.44–1.00)
Calcium: 9.4 mg/dL (ref 8.9–10.3)
Glucose, Bld: 131 mg/dL — ABNORMAL HIGH (ref 65–99)
POTASSIUM: 2.7 mmol/L — AB (ref 3.5–5.1)
Sodium: 142 mmol/L (ref 135–145)
TOTAL PROTEIN: 6.3 g/dL — AB (ref 6.5–8.1)

## 2017-01-17 LAB — LIPASE, BLOOD: Lipase: 27 U/L (ref 11–51)

## 2017-01-17 MED ORDER — POTASSIUM CHLORIDE 20 MEQ PO PACK
40.0000 meq | PACK | Freq: Once | ORAL | Status: AC
  Administered 2017-01-17: 40 meq via ORAL
  Filled 2017-01-17: qty 2

## 2017-01-17 MED ORDER — MAGNESIUM CITRATE PO SOLN
1.0000 | Freq: Once | ORAL | Status: AC
  Administered 2017-01-17: 1 via ORAL
  Filled 2017-01-17: qty 296

## 2017-01-17 MED ORDER — POLYETHYLENE GLYCOL 3350 17 G PO PACK: 17.0000 g | PACK | Freq: Two times a day (BID) | ORAL | 0 refills | Status: DC

## 2017-01-17 MED ORDER — DOCUSATE SODIUM 50 MG/5ML PO LIQD
100.0000 mg | Freq: Once | ORAL | Status: AC
  Administered 2017-01-17: 100 mg via ORAL
  Filled 2017-01-17: qty 10

## 2017-01-17 MED ORDER — POLYETHYLENE GLYCOL 3350 17 G PO PACK
17.0000 g | PACK | Freq: Once | ORAL | Status: AC
  Administered 2017-01-17: 17 g via ORAL
  Filled 2017-01-17: qty 1

## 2017-01-17 MED ORDER — HYDROCORTISONE ACETATE 25 MG RE SUPP
25.0000 mg | Freq: Once | RECTAL | Status: AC
  Administered 2017-01-17: 25 mg via RECTAL
  Filled 2017-01-17: qty 1

## 2017-01-17 MED ORDER — MINERAL OIL RE ENEM
1.0000 | ENEMA | Freq: Once | RECTAL | Status: AC
  Administered 2017-01-17: 1 via RECTAL

## 2017-01-17 MED ORDER — KETOROLAC TROMETHAMINE 10 MG PO TABS
10.0000 mg | ORAL_TABLET | Freq: Once | ORAL | Status: DC
Start: 2017-01-17 — End: 2017-01-17
  Filled 2017-01-17: qty 1

## 2017-01-17 NOTE — ED Notes (Signed)
Pt with very small amount of stool production, pt was able to pass small amount of stool that was blocking rectum but no further stool produced. Pt refuses to sit on commode, refuses to attempt to push stool. Pt states "you need to numb up my butthole."

## 2017-01-17 NOTE — ED Notes (Signed)
Enema administered. Pt tolerated poorly.

## 2017-01-17 NOTE — ED Notes (Signed)
Pt has a very hard golf ball sized amount of stool blocking exit to rectum. Pt will not allow RN to disimpact and refuses to attempt to push stool out of rectum. md notified and in to disimpact pt.

## 2017-01-17 NOTE — ED Triage Notes (Addendum)
Pt c/o generalized abd pain for 6 days; no bowel movement in the same amount of time; denies nausea/vomiting; pt agitated in triage, refusing to talk at times because she just wants medication to make her poop; c/o the wheelchair being too hard, wait time in the lobby, rectal and hemorrhoid pain...; pt adds she's very depressed but denies suicidal thoughts-"not right now"; says she doesn't wish to speak with anyone about her depression tonight; given pillow to sit on for comfort; assisted via wheelchair to bathroom as pt says she feels like she needs to have a bowel movement even if nothing comes out;

## 2017-01-17 NOTE — ED Notes (Signed)
Pt states "go ahead and get that poop out, put your finger up there if you have to, just put that mineral oil on your hand, fuck you make sure you go slow." pt then immediately starts to hit this rn on her arm during attempt to disimpact. Procedure immediately stopped and pt informed if she hits this rn again "i will not be able to help you anymore". Pt verbalizes understanding while continuing to loudly swear at RN to "hurry up, you're [name]' too long to get this out of me."

## 2017-01-17 NOTE — ED Notes (Signed)
Continuing to attempt to discharge pt but every time attempting to obtain vital signs or review discharge instructions pt yells "i got to shit" and gets up to bedside commode.

## 2017-01-17 NOTE — ED Triage Notes (Signed)
Patient brought in by ems from home. Per EMS patient with complaint of constipation times 6 days. Patient has taken OTC laxative with no results.

## 2017-01-17 NOTE — ED Provider Notes (Signed)
[facility] Emergency Department Provider Note   First MD Initiated Contact with Patient 01/17/17 0120     (approximate)  I have reviewed the triage vital signs and the nursing notes.   HISTORY  Chief Complaint Abdominal Pain and Constipation    HPI Meredith Juarez is a 56 y.o. female with below list of chronic medical conditions presents to the emergency department with constipation 6 days accompanied by generalized abdominal cramping. Patient denies any fever no vomiting or nausea. Patient denies any urinary symptoms. Patient states her current pain score is 8 out of 10. Patient denies any aggravating or alleviating factors.   Past Medical History:  Diagnosis Date  . Anemia   . Depression   . Diverticulitis   . GERD (gastroesophageal reflux disease)   . Hypertension   . Migraines   . Stroke ([place])   . Thyroid disease     Patient Active Problem List   Diagnosis Date Noted  . Arthritis of ankle 12/28/2016  . Shoulder arthritis 12/28/2016  . H/O: stroke 12/27/2016  . Right sided weakness 12/27/2016  . Depression 12/27/2016  . GERD (gastroesophageal reflux disease) 11/07/2016  . Tobacco use disorder 11/07/2016  . Alcohol use disorder, severe, dependence ([place]) 11/07/2016  . Severe episode of recurrent major depressive disorder, without psychotic features ([place])   . Migraines 09/05/2014  . Hypertension 01/01/2014    Past Surgical History:  Procedure Laterality Date  . NO PAST SURGERIES      Prior to Admission medications   Medication Sig Start Date End Date Taking? Authorizing Provider  acetaminophen (TYLENOL) 500 MG tablet Take 500 mg by mouth every 4 (four) hours as needed.    [provider]  amLODipine (NORVASC) 10 MG tablet Take 1 tablet (10 mg total) by mouth daily. 11/10/16   Pucilowska, Herma Ard B, MD  aspirin EC 81 MG tablet Take 1 tablet (81 mg total) by mouth daily. 11/09/16   Pucilowska, Jolanta B, MD  atorvastatin  (LIPITOR) 40 MG tablet Take 1 tablet (40 mg total) by mouth daily. 12/27/16 12/27/17  Hillary Bow, MD  clopidogrel (PLAVIX) 75 MG tablet Take 1 tablet (75 mg total) by mouth daily. 11/09/16   Pucilowska, Jolanta B, MD  hydrochlorothiazide (HYDRODIURIL) 25 MG tablet Take 1 tablet (25 mg total) by mouth daily. 11/09/16   Pucilowska, Herma Ard B, MD  ibuprofen (ADVIL,MOTRIN) 600 MG tablet Take 1 tablet (600 mg total) by mouth every 8 (eight) hours as needed. 11/11/16   Darel Hong, MD  metoprolol tartrate (LOPRESSOR) 50 MG tablet Take 1 tablet (50 mg total) by mouth daily. 11/09/16 11/09/17  Pucilowska, Wardell Honour, MD  nitroGLYCERIN (NITROSTAT) 0.4 MG SL tablet Place 1 tablet (0.4 mg total) under the tongue every 5 (five) minutes as needed for chest pain. 11/09/16   Pucilowska, Jolanta B, MD  potassium chloride SA (K-DUR,KLOR-CON) 20 MEQ tablet Take 1 tablet (20 mEq total) by mouth daily. 11/09/16   Pucilowska, Herma Ard B, MD  QUEtiapine (SEROQUEL) 200 MG tablet Take 1 tablet (200 mg total) by mouth at bedtime. 11/09/16   Pucilowska, Herma Ard B, MD  sertraline (ZOLOFT) 100 MG tablet Take 1 tablet (100 mg total) by mouth daily. 11/09/16   Pucilowska, Herma Ard B, MD  traZODone (DESYREL) 100 MG tablet Take 1 tablet (100 mg total) by mouth at bedtime. 1/2 - 1 tablet as needed 11/09/16   Pucilowska, Jolanta B, MD    Allergies No known drug allergies  Family History  Problem Relation Age of Onset  .  Cancer Mother        Uterine, Breast, Ovarian  . Hypertension Mother   . Hypertension Father   . Bipolar disorder Father   . CAD Sister   . Hyperlipidemia Sister     Social History Social History  Substance Use Topics  . Smoking status: Current Every Day Smoker    Packs/day: 0.25    Types: Cigarettes  . Smokeless tobacco: Never Used  . Alcohol use Yes     Comment: Ocassionally    Review of Systems Constitutional: No fever/chills Eyes: No visual changes. ENT: No sore throat. Cardiovascular: Denies chest  pain. Respiratory: Denies shortness of breath. Gastrointestinal: Positive for abdominal pain and constipation  Genitourinary: Negative for dysuria. Musculoskeletal: Negative for neck pain.  Negative for back pain. Integumentary: Negative for rash. Neurological: Negative for headaches, focal weakness or numbness.   ____________________________________________   PHYSICAL EXAM:  VITAL SIGNS: ED Triage Vitals  Enc Vitals Group     BP 01/17/17 0037 (!) 156/94     Pulse Rate 01/17/17 0037 98     Resp 01/17/17 0037 18     Temp 01/17/17 0037 99 F (37.2 C)     Temp Source 01/17/17 0037 Oral     SpO2 01/17/17 0037 100 %     Weight 01/17/17 0038 82.1 kg (181 lb)     Height 01/17/17 0038 1.676 m (5\' 6" )     Head Circumference --      Peak Flow --      Pain Score 01/17/17 0037 8     Pain Loc --      Pain Edu? --      Excl. in [place]? --     Constitutional: Alert and oriented. Well appearing and in no acute distress. Eyes: Conjunctivae are normal. Head: Atraumatic. Mouth/Throat: Mucous membranes are moist.  Oropharynx non-erythematous. Neck: No stridor.   Cardiovascular: Normal rate, regular rhythm. Good peripheral circulation. Grossly normal heart sounds. Respiratory: Normal respiratory effort.  No retractions. Lungs CTAB. Gastrointestinal: Soft and nontender. No distention. Rectal exam revealed tenderness in all size hard stool in rectal vault Musculoskeletal: No lower extremity tenderness nor edema. No gross deformities of extremities. Neurologic:  Normal speech and language. No gross focal neurologic deficits are appreciated.  Skin:  Skin is warm, dry and intact. No rash noted. Psychiatric: Mood and affect are normal. Speech and behavior are normal.  ____________________________________________   LABS (all labs ordered are listed, but only abnormal results are displayed)  Labs Reviewed  CBC - Abnormal; Notable for the following:       Result Value   RBC 5.60 (*)    All  other components within normal limits  LIPASE, BLOOD  COMPREHENSIVE METABOLIC PANEL  URINALYSIS, COMPLETE (UACMP) WITH MICROSCOPIC   _  RADIOLOGY I, [place] Jahziah Simonin, personally viewed and evaluated these images (plain radiographs) as part of my medical decision making, as well as reviewing the written report by the radiologist.  Dg Abdomen 1 View  Result Date: 01/17/2017 CLINICAL DATA:  Generalized abdominal pain for 6 days EXAM: ABDOMEN - 1 VIEW COMPARISON:  CT 10/31/2013, radiograph 11/06/2007 FINDINGS: Visualized lung bases are clear. Nonobstructed bowel-gas pattern. Coarse calcifications within the pelvis as before suggesting calcified uterine fibroids. Possible left pelvic phleboliths. IMPRESSION: Nonobstructed gas pattern. Calcified uterine fibroids. Coarse calcification left pelvis measuring 5 mm, could relate to phlebolith, if symptoms are suggestive of a stone, CT KUB could be obtained. Electronically Signed   By: Madie Reno.D.  On: 01/17/2017 01:18     Fecal disimpaction Date/Time: 01/17/2017 5:18 AM Performed by: Gregor Hams Authorized by: Gregor Hams  Consent: Verbal consent obtained. Written consent not obtained. Consent given by: patient Patient understanding: patient states understanding of the procedure being performed Test results: test results available and properly labeled Imaging studies: imaging studies available Local anesthesia used: no  Anesthesia: Local anesthesia used: no  Sedation: Patient sedated: no Patient tolerance: Patient tolerated the procedure well with no immediate complications      ____________________________________________   INITIAL IMPRESSION / ASSESSMENT AND PLAN / ED COURSE  Pertinent labs & imaging results that were available during my care of the patient were reviewed by me and considered in my medical decision making (see chart for details).  56 year old female presenting with fecal impaction constipation  and potentially ileus. Patient noted to be hypokalemic on laboratory data. Fecal disimpaction performed an enema given with good results.      ____________________________________________  FINAL CLINICAL IMPRESSION(S) / ED DIAGNOSES  Final diagnoses:  Constipation, unspecified constipation type  Ileus ([place])  Hypokalemia     MEDICATIONS GIVEN DURING THIS VISIT:  Medications  magnesium citrate solution 1 Bottle (not administered)  docusate (COLACE) 50 MG/5ML liquid 100 mg (not administered)  mineral oil enema 1 enema (not administered)     NEW OUTPATIENT MEDICATIONS STARTED DURING THIS VISIT:  New Prescriptions   No medications on file    Modified Medications   No medications on file    Discontinued Medications   No medications on file     Note:  This document was prepared using Dragon voice recognition software and may include unintentional dictation errors.    Gregor Hams, MD 01/17/17 [phone]

## 2017-01-17 NOTE — ED Notes (Signed)
Pt states "you dischargin' me now, I ain't got no way home, you need to get me a ride home. I don't want to go out to that lobby and have to fall again, that gonna make me fall." pt has been ambulatory in room without difficulty entire treatment time. Pt has sworn at [facility] and loudly commanded treatment since introducing self to pt. Pt states "i came in the ambulance, you got to get me home, shit, I ain't goin' out to that lobby."

## 2017-01-17 NOTE — ED Notes (Signed)
Pt tolerated md attempt to disimpact poorly. Pt refused to allow md to complete procedure.

## 2017-01-17 NOTE — ED Notes (Signed)
Critical potassium of 2.7 called from paula in lab. Dr. Owens Shark notified, orders for KCL po received.

## 2017-01-17 NOTE — ED Notes (Signed)
Pt states she is constipated. Pt states she is also depressed but "i don't want to talk about it, people be hurtin' and you askin' all these questions."pt states is not sure when last bowel movement was. Pt denies vomiting. Pt states she has not been taking her medication for depression "because I don't know if that's what's causin' this". Pt with normal color warm and dry skin. resps unlabored.

## 2017-05-12 ENCOUNTER — Emergency Department
Admission: EM | Admit: 2017-05-12 | Discharge: 2017-05-12 | Disposition: A | Discharge status: Home / Self Care | Payer: Self-pay | Attending: Emergency Medicine | Admitting: Emergency Medicine

## 2017-05-12 ENCOUNTER — Encounter: Payer: Self-pay | Admitting: Emergency Medicine

## 2017-05-12 DIAGNOSIS — F17210 Nicotine dependence, cigarettes, uncomplicated: Secondary | ICD-10-CM | POA: Insufficient documentation

## 2017-05-12 DIAGNOSIS — I10 Essential (primary) hypertension: Secondary | ICD-10-CM | POA: Insufficient documentation

## 2017-05-12 DIAGNOSIS — Z7902 Long term (current) use of antithrombotics/antiplatelets: Secondary | ICD-10-CM | POA: Insufficient documentation

## 2017-05-12 DIAGNOSIS — Z7982 Long term (current) use of aspirin: Secondary | ICD-10-CM | POA: Insufficient documentation

## 2017-05-12 DIAGNOSIS — Z79899 Other long term (current) drug therapy: Secondary | ICD-10-CM | POA: Insufficient documentation

## 2017-05-12 DIAGNOSIS — N950 Postmenopausal bleeding: Secondary | ICD-10-CM | POA: Insufficient documentation

## 2017-05-12 DIAGNOSIS — Z8673 Personal history of transient ischemic attack (TIA), and cerebral infarction without residual deficits: Secondary | ICD-10-CM | POA: Insufficient documentation

## 2017-05-12 DIAGNOSIS — R102 Pelvic and perineal pain: Secondary | ICD-10-CM | POA: Insufficient documentation

## 2017-05-12 LAB — CBC
HEMATOCRIT: 48 % — AB (ref 35.0–47.0)
Hemoglobin: 15.9 g/dL (ref 12.0–16.0)
MCH: 27.7 pg (ref 26.0–34.0)
MCHC: 33.1 g/dL (ref 32.0–36.0)
MCV: 83.7 fL (ref 80.0–100.0)
Platelets: 271 10*3/uL (ref 150–440)
RBC: 5.74 MIL/uL — AB (ref 3.80–5.20)
RDW: 15.1 % — AB (ref 11.5–14.5)
WBC: 6.5 10*3/uL (ref 3.6–11.0)

## 2017-05-12 LAB — BASIC METABOLIC PANEL
ANION GAP: 9 (ref 5–15)
BUN: 10 mg/dL (ref 6–20)
CO2: 23 mmol/L (ref 22–32)
Calcium: 9.5 mg/dL (ref 8.9–10.3)
Chloride: 106 mmol/L (ref 101–111)
Creatinine, Ser: 0.57 mg/dL (ref 0.44–1.00)
GFR calc Af Amer: >60 mL/min (ref >60)
GFR calc non Af Amer: >60 mL/min (ref >60)
GLUCOSE: 90 mg/dL (ref 65–99)
POTASSIUM: 3.7 mmol/L (ref 3.5–5.1)
Sodium: 138 mmol/L (ref 135–145)

## 2017-05-12 LAB — HCG, QUANTITATIVE, PREGNANCY: hCG, Beta Chain, Quant, S: 2 m[IU]/mL (ref <5)

## 2017-05-12 MED ORDER — AMLODIPINE BESYLATE 10 MG PO TABS: 10.0000 mg | ORAL_TABLET | Freq: Every day | ORAL | 0 refills | Status: DC

## 2017-05-12 MED ORDER — IBUPROFEN 400 MG PO TABS
600.0000 mg | ORAL_TABLET | Freq: Once | ORAL | Status: DC
  Filled 2017-05-12: qty 2

## 2017-05-12 MED ORDER — IBUPROFEN 800 MG PO TABS
800.0000 mg | ORAL_TABLET | Freq: Once | ORAL | Status: AC
Start: 2017-05-12 — End: 2017-05-12
  Administered 2017-05-12: 800 mg via ORAL

## 2017-05-12 MED ORDER — METOPROLOL TARTRATE 50 MG PO TABS: 50.0000 mg | ORAL_TABLET | Freq: Every day | ORAL | 0 refills | Status: DC

## 2017-05-12 MED ORDER — IBUPROFEN 600 MG PO TABS: 600.0000 mg | ORAL_TABLET | Freq: Three times a day (TID) | ORAL | 0 refills | Status: DC | PRN

## 2017-05-12 NOTE — ED Provider Notes (Signed)
[facility] Emergency Department Provider Note  ____________________________________________   First MD Initiated Contact with Patient 05/12/17 1446     (approximate)  I have reviewed the triage vital signs and the nursing notes.   HISTORY  Chief Complaint Vaginal Bleeding    HPI Meredith Juarez is a 56 y.o. female who self presents to the emergency department with roughly 1 week of daily vaginal bleeding.  She went through menopause roughly 5 years ago and has not had any vaginal bleeding until this week.  She has mild severity cramping lower abdominal discomfort that feels like her previous period.  She has not followed up with primary care OB gynecology and "sometime".  Her symptoms began suddenly are constant nothing seems to make them better or worse.  She has not had sex or use sex toys in "a long time".   Past Medical History:  Diagnosis Date  . Anemia   . Depression   . Diverticulitis   . GERD (gastroesophageal reflux disease)   . Hypertension   . Migraines   . Stroke ([place])   . Thyroid disease     Patient Active Problem List   Diagnosis Date Noted  . Arthritis of ankle 12/28/2016  . Shoulder arthritis 12/28/2016  . H/O: stroke 12/27/2016  . Right sided weakness 12/27/2016  . Depression 12/27/2016  . GERD (gastroesophageal reflux disease) 11/07/2016  . Tobacco use disorder 11/07/2016  . Alcohol use disorder, severe, dependence ([place]) 11/07/2016  . Severe episode of recurrent major depressive disorder, without psychotic features ([place])   . Migraines 09/05/2014  . Hypertension 01/01/2014    Past Surgical History:  Procedure Laterality Date  . NO PAST SURGERIES      Prior to Admission medications   Medication Sig Start Date End Date Taking? Authorizing Provider  acetaminophen (TYLENOL) 500 MG tablet Take 500 mg by mouth every 4 (four) hours as needed.    [provider]  amLODipine (NORVASC) 10 MG tablet Take 1 tablet (10 mg  total) daily by mouth. 05/12/17   Darel Hong, MD  aspirin EC 81 MG tablet Take 1 tablet (81 mg total) by mouth daily. 11/09/16   Pucilowska, Jolanta B, MD  atorvastatin (LIPITOR) 40 MG tablet Take 1 tablet (40 mg total) by mouth daily. 12/27/16 12/27/17  Hillary Bow, MD  clopidogrel (PLAVIX) 75 MG tablet Take 1 tablet (75 mg total) by mouth daily. 11/09/16   Pucilowska, Jolanta B, MD  hydrochlorothiazide (HYDRODIURIL) 25 MG tablet Take 1 tablet (25 mg total) by mouth daily. 11/09/16   Pucilowska, Herma Ard B, MD  ibuprofen (ADVIL,MOTRIN) 600 MG tablet Take 1 tablet (600 mg total) by mouth every 8 (eight) hours as needed. 11/11/16   Darel Hong, MD  ibuprofen (ADVIL,MOTRIN) 600 MG tablet Take 1 tablet (600 mg total) every 8 (eight) hours as needed by mouth. 05/12/17   Darel Hong, MD  metoprolol tartrate (LOPRESSOR) 50 MG tablet Take 1 tablet (50 mg total) daily by mouth. 05/12/17 05/12/18  Darel Hong, MD  nitroGLYCERIN (NITROSTAT) 0.4 MG SL tablet Place 1 tablet (0.4 mg total) under the tongue every 5 (five) minutes as needed for chest pain. 11/09/16   Pucilowska, Jolanta B, MD  polyethylene glycol (MIRALAX) packet Take 17 g by mouth 2 (two) times daily. 01/17/17   Gregor Hams, MD  potassium chloride SA (K-DUR,KLOR-CON) 20 MEQ tablet Take 1 tablet (20 mEq total) by mouth daily. 11/09/16   Pucilowska, Herma Ard B, MD  QUEtiapine (SEROQUEL) 200 MG tablet Take  1 tablet (200 mg total) by mouth at bedtime. 11/09/16   Pucilowska, Herma Ard B, MD  sertraline (ZOLOFT) 100 MG tablet Take 1 tablet (100 mg total) by mouth daily. 11/09/16   Pucilowska, Herma Ard B, MD  traZODone (DESYREL) 100 MG tablet Take 1 tablet (100 mg total) by mouth at bedtime. 1/2 - 1 tablet as needed 11/09/16   Pucilowska, Wardell Honour, MD    Allergies Patient has no known allergies.  Family History  Problem Relation Age of Onset  . Cancer Mother        Uterine, Breast, Ovarian  . Hypertension Mother   . Hypertension Father     . Bipolar disorder Father   . CAD Sister   . Hyperlipidemia Sister     Social History Social History   Tobacco Use  . Smoking status: Current Every Day Smoker    Packs/day: 0.25    Types: Cigarettes  . Smokeless tobacco: Never Used  Substance Use Topics  . Alcohol use: Yes    Comment: Ocassionally  . Drug use: No    Review of Systems Constitutional: No fever/chills ENT: No sore throat. Cardiovascular: Denies chest pain. Respiratory: Denies shortness of breath. Gastrointestinal: Positive for abdominal pain.  No nausea, no vomiting.  No diarrhea.  No constipation. Musculoskeletal: Negative for back pain. Neurological: Negative for headaches   ____________________________________________   PHYSICAL EXAM:  VITAL SIGNS: ED Triage Vitals [05/12/17 1335]  Enc Vitals Group     BP (!) 144/82     Pulse Rate 72     Resp 18     Temp 99.3 F (37.4 C)     Temp Source Oral     SpO2 99 %     Weight 188 lb (85.3 kg)     Height 5\' 6"  (1.676 m)     Head Circumference      Peak Flow      Pain Score      Pain Loc      Pain Edu?      Excl. in [place]?     Constitutional: Alert and oriented x4 pleasant cooperative no diaphoresis Head: Atraumatic. Nose: No congestion/rhinnorhea. Mouth/Throat: No trismus Neck: No stridor.   Cardiovascular: Regular rate and rhythm Respiratory: Normal respiratory effort.  No retractions. Gastrointestinal: Soft nontender Pelvic exam chaperoned by female nurse Lorrie: Normal external exam.  No vaginal lacerations.  Also closed with slight dark blood coming from the cervical office.  No obvious cervical lesions noted Neurologic:  Normal speech and language. No gross focal neurologic deficits are appreciated.  Skin:  Skin is warm, dry and intact. No rash noted.    ____________________________________________  LABS (all labs ordered are listed, but only abnormal results are displayed)  Labs Reviewed  CBC - Abnormal; Notable for the following  components:      Result Value   RBC 5.74 (*)    HCT 48.0 (*)    RDW 15.1 (*)    All other components within normal limits  BASIC METABOLIC PANEL  HCG, QUANTITATIVE, PREGNANCY    Blood work reviewed and interpreted by me with no acute disease __________________________________________  EKG   ____________________________________________  RADIOLOGY   ____________________________________________   DIFFERENTIAL includes but not limited to  Endometrial cancer, cervical cancer, vaginal laceration   PROCEDURES  Procedure(s) performed: no  Procedures  Critical Care performed: no  Observation: no ____________________________________________   INITIAL IMPRESSION / ASSESSMENT AND PLAN / ED COURSE  Pertinent labs & imaging results that were available during my care  of the patient were reviewed by me and considered in my medical decision making (see chart for details).  The patient is hemodynamically stable well-appearing.  She has a benign abdominal exam.  She is postmenopausal with bleeding obviously coming from her cervical office.  This raises the concern for endometrial cancer.  She is not anemic and does not require medication to stop the bleeding at this time.  She has no OB gynecologist will refer her for outpatient management.  She verbalized understanding and agreement with the plan.     ----------------------------------------- 4:18 PM on 05/12/2017 -----------------------------------------  After discharge the patient requested a refill of her amlodipine and metoprolol if she does not have a follow-up with her primary care physician for another 3-1/2 weeks. ____________________________________________   FINAL CLINICAL IMPRESSION(S) / ED DIAGNOSES  Final diagnoses:  Post-menopausal bleeding      NEW MEDICATIONS STARTED DURING THIS VISIT:  This SmartLink is deprecated. Use AVSMEDLIST instead to display the medication list for a patient.   Note:  This  document was prepared using Dragon voice recognition software and may include unintentional dictation errors.      Darel Hong, MD 05/13/17 1432

## 2017-05-12 NOTE — ED Notes (Signed)
As pt is walking out of triage, pt states, "I hope I don't fall in here."  Pt stopped and offered wheelchair per this RN.  Pt refuses, stating, "that makes me feel too helpless."

## 2017-05-12 NOTE — ED Triage Notes (Signed)
Pt in via POV with complaints of intermittent vaginal bleeding and left pelvic pain x approximately 3 weeks.  Pt denies any pain at this time, denies any other complaints.  Vitals WDL, NAD noted.

## 2017-05-12 NOTE — Discharge Instructions (Signed)
Please make an appointment to follow-up with OB gynecologist within 1 week for reevaluation.  Return to the emergency department at any point sooner for any concerns whatsoever.  It was a pleasure to take care of you today, and thank you for coming to our emergency department.  If you have any questions or concerns before leaving please ask the nurse to grab me and I'm more than happy to go through your aftercare instructions again.  If you were prescribed any opioid pain medication today such as Norco, Vicodin, Percocet, morphine, hydrocodone, or oxycodone please make sure you do not drive when you are taking this medication as it can alter your ability to drive safely.  If you have any concerns once you are home that you are not improving or are in fact getting worse before you can make it to your follow-up appointment, please do not hesitate to call [phone] and come back for further evaluation.  Darel Hong, MD  Results for orders placed or performed during the hospital encounter of 05/12/17  CBC  Result Value Ref Range   WBC 6.5 3.6 - 11.0 K/uL   RBC 5.74 (H) 3.80 - 5.20 MIL/uL   Hemoglobin 15.9 12.0 - 16.0 g/dL   HCT 48.0 (H) 35.0 - 47.0 %   MCV 83.7 80.0 - 100.0 fL   MCH 27.7 26.0 - 34.0 pg   MCHC 33.1 32.0 - 36.0 g/dL   RDW 15.1 (H) 11.5 - 14.5 %   Platelets 271 150 - 440 K/uL  Basic metabolic panel  Result Value Ref Range   Sodium 138 135 - 145 mmol/L   Potassium 3.7 3.5 - 5.1 mmol/L   Chloride 106 101 - 111 mmol/L   CO2 23 22 - 32 mmol/L   Glucose, Bld 90 65 - 99 mg/dL   BUN 10 6 - 20 mg/dL   Creatinine, Ser 0.57 0.44 - 1.00 mg/dL   Calcium 9.5 8.9 - 10.3 mg/dL   GFR calc non Af Amer >60 >60 mL/min   GFR calc Af Amer >60 >60 mL/min   Anion gap 9 5 - 15  hCG, quantitative, pregnancy  Result Value Ref Range   hCG, Beta Chain, Quant, S 2 <5 mIU/mL

## 2017-05-13 ENCOUNTER — Other Ambulatory Visit: Payer: Self-pay | Admitting: Internal Medicine

## 2017-06-08 ENCOUNTER — Ambulatory Visit: Payer: Self-pay | Admitting: Internal Medicine

## 2017-06-08 ENCOUNTER — Encounter: Payer: Self-pay | Admitting: Internal Medicine

## 2017-06-08 VITALS — BP 108/72 | HR 73 | Wt 188.3 lb

## 2017-06-08 DIAGNOSIS — I10 Essential (primary) hypertension: Secondary | ICD-10-CM

## 2017-06-08 MED ORDER — METOPROLOL TARTRATE 50 MG PO TABS: 50.0000 mg | ORAL_TABLET | Freq: Every day | ORAL | 3 refills | Status: DC

## 2017-06-08 MED ORDER — AMLODIPINE BESYLATE 10 MG PO TABS: 10.0000 mg | ORAL_TABLET | Freq: Every day | ORAL | 3 refills | Status: DC

## 2017-06-08 MED ORDER — HYDROCHLOROTHIAZIDE 25 MG PO TABS: 25.0000 mg | ORAL_TABLET | Freq: Every day | ORAL | 3 refills | Status: DC

## 2017-06-08 MED ORDER — ASPIRIN EC 81 MG PO TBEC: 81.0000 mg | DELAYED_RELEASE_TABLET | Freq: Every day | ORAL | 3 refills | Status: DC

## 2017-06-08 NOTE — Progress Notes (Signed)
Subjective:    Patient ID: Meredith Juarez, female    DOB: 10-27-1960, 56 y.o.   MRN: [id number]  HPI   Pt does not have a PCP and has no means to get medication. Pt presents with a chief complaint of vaginal bleeding even though she experienced menopause about 6 years ago. She states that it is not heavy bleeding, but it is consistent.  Pt has been going to the ED to get prescriptions for hypertension. [facility] handles pt's depression medication.   Patient Active Problem List   Diagnosis Date Noted  . Arthritis of ankle 12/28/2016  . Shoulder arthritis 12/28/2016  . H/O: stroke 12/27/2016  . Right sided weakness 12/27/2016  . Depression 12/27/2016  . GERD (gastroesophageal reflux disease) 11/07/2016  . Tobacco use disorder 11/07/2016  . Alcohol use disorder, severe, dependence ([place]) 11/07/2016  . Severe episode of recurrent major depressive disorder, without psychotic features ([place])   . Migraines 09/05/2014  . Hypertension 01/01/2014    Allergies as of 06/08/2017   No Known Allergies     Medication List        Accurate as of 06/08/17 10:16 AM. Always use your most recent med list.          acetaminophen 500 MG tablet Commonly known as:  TYLENOL Take 500 mg by mouth every 4 (four) hours as needed.   amLODipine 10 MG tablet Commonly known as:  NORVASC Take 1 tablet (10 mg total) daily by mouth.   aspirin EC 81 MG tablet Take 1 tablet (81 mg total) by mouth daily.   atorvastatin 40 MG tablet Commonly known as:  LIPITOR Take 1 tablet (40 mg total) by mouth daily.   clopidogrel 75 MG tablet Commonly known as:  PLAVIX Take 1 tablet (75 mg total) by mouth daily.   hydrochlorothiazide 25 MG tablet Commonly known as:  HYDRODIURIL Take 1 tablet (25 mg total) by mouth daily.   ibuprofen 600 MG tablet Commonly known as:  ADVIL,MOTRIN Take 1 tablet (600 mg total) by mouth every 8 (eight) hours as needed.   metoprolol tartrate 50 MG tablet Commonly known as:   LOPRESSOR Take 1 tablet (50 mg total) daily by mouth.   nitroGLYCERIN 0.4 MG SL tablet Commonly known as:  NITROSTAT Place 1 tablet (0.4 mg total) under the tongue every 5 (five) minutes as needed for chest pain.   polyethylene glycol packet Commonly known as:  MIRALAX Take 17 g by mouth 2 (two) times daily.   potassium chloride SA 20 MEQ tablet Commonly known as:  K-DUR,KLOR-CON Take 1 tablet (20 mEq total) by mouth daily.   QUEtiapine 200 MG tablet Commonly known as:  SEROQUEL Take 1 tablet (200 mg total) by mouth at bedtime.   sertraline 100 MG tablet Commonly known as:  ZOLOFT Take 1 tablet (100 mg total) by mouth daily.   traZODone 100 MG tablet Commonly known as:  DESYREL Take 1 tablet (100 mg total) by mouth at bedtime. 1/2 - 1 tablet as needed       Review of Systems     Objective:   Physical Exam  Constitutional: She is oriented to person, place, and time.  Cardiovascular: Normal rate, regular rhythm and normal heart sounds.  Pulmonary/Chest: Effort normal and breath sounds normal.  Neurological: She is alert and oriented to person, place, and time.   BP 108/72   Pulse 73   Wt 188 lb 4.8 oz (85.4 kg)   LMP  (LMP Unknown)   BMI  30.39 kg/m       Assessment & Plan:   Refill available medications.  Order labs today: TSH, lipid panel  F/u in 4 weeks with labs a week prior: MetC, CBC, and UA  Provide referral for gyno for post-menopausal bleeding   Request old records.

## 2017-06-08 NOTE — Addendum Note (Signed)
Addended by: Dayton Martes on: 06/08/2017 11:30 AM   Modules accepted: Orders

## 2017-06-09 LAB — LIPID PANEL
CHOL/HDL RATIO: 3.8 ratio (ref 0.0–4.4)
Cholesterol, Total: 172 mg/dL (ref 100–199)
HDL: 45 mg/dL (ref >39)
LDL CALC: 97 mg/dL (ref 0–99)
Triglycerides: 148 mg/dL (ref 0–149)
VLDL CHOLESTEROL CAL: 30 mg/dL (ref 5–40)

## 2017-06-09 LAB — TSH: TSH: 3.02 u[IU]/mL (ref 0.450–4.500)

## 2017-06-29 ENCOUNTER — Other Ambulatory Visit: Payer: Self-pay

## 2017-07-06 ENCOUNTER — Encounter: Payer: Self-pay | Admitting: Internal Medicine

## 2017-07-06 ENCOUNTER — Ambulatory Visit: Payer: No Typology Code available for payment source | Admitting: Internal Medicine

## 2017-07-06 VITALS — BP 128/85 | HR 65 | Temp 98.0°F | Wt 187.9 lb

## 2017-07-06 DIAGNOSIS — I10 Essential (primary) hypertension: Secondary | ICD-10-CM

## 2017-07-06 DIAGNOSIS — Z0000 Encounter for general adult medical examination without abnormal findings: Secondary | ICD-10-CM

## 2017-07-06 NOTE — Progress Notes (Signed)
Subjective:    Patient ID: Meredith Juarez, female    DOB: 09-11-1960, 57 y.o.   MRN: [id number]  HPI   Diagnosis of possible of TIA years ago. Follow-up MRI in 2018 was normal. No therapeutical reason to continue taking plavix. Pt takes one aspirin daily and does not currently take plavix.    Allergies as of 07/06/2017   No Known Allergies     Medication List        Accurate as of 07/06/17 11:42 AM. Always use your most recent med list.          acetaminophen 500 MG tablet Commonly known as:  TYLENOL Take 500 mg by mouth every 4 (four) hours as needed.   amLODipine 10 MG tablet Commonly known as:  NORVASC Take 1 tablet (10 mg total) by mouth daily.   aspirin EC 81 MG tablet Take 1 tablet (81 mg total) by mouth daily.   atorvastatin 40 MG tablet Commonly known as:  LIPITOR Take 1 tablet (40 mg total) by mouth daily.   clopidogrel 75 MG tablet Commonly known as:  PLAVIX Take 1 tablet (75 mg total) by mouth daily.   hydrochlorothiazide 25 MG tablet Commonly known as:  HYDRODIURIL Take 1 tablet (25 mg total) by mouth daily.   ibuprofen 600 MG tablet Commonly known as:  ADVIL,MOTRIN Take 1 tablet (600 mg total) by mouth every 8 (eight) hours as needed.   metoprolol tartrate 50 MG tablet Commonly known as:  LOPRESSOR Take 1 tablet (50 mg total) by mouth daily.   nitroGLYCERIN 0.4 MG SL tablet Commonly known as:  NITROSTAT Place 1 tablet (0.4 mg total) under the tongue every 5 (five) minutes as needed for chest pain.   polyethylene glycol packet Commonly known as:  MIRALAX Take 17 g by mouth 2 (two) times daily.   potassium chloride SA 20 MEQ tablet Commonly known as:  K-DUR,KLOR-CON Take 1 tablet (20 mEq total) by mouth daily.   QUEtiapine 200 MG tablet Commonly known as:  SEROQUEL Take 1 tablet (200 mg total) by mouth at bedtime.   sertraline 100 MG tablet Commonly known as:  ZOLOFT Take 1 tablet (100 mg total) by mouth daily.   traZODone 100 MG  tablet Commonly known as:  DESYREL Take 1 tablet (100 mg total) by mouth at bedtime. 1/2 - 1 tablet as needed      Patient Active Problem List   Diagnosis Date Noted  . Arthritis of ankle 12/28/2016  . Shoulder arthritis 12/28/2016  . H/O: stroke 12/27/2016  . Right sided weakness 12/27/2016  . Depression 12/27/2016  . GERD (gastroesophageal reflux disease) 11/07/2016  . Tobacco use disorder 11/07/2016  . Alcohol use disorder, severe, dependence ([place]) 11/07/2016  . Severe episode of recurrent major depressive disorder, without psychotic features ([place])   . Migraines 09/05/2014  . Hypertension 01/01/2014     Review of Systems     Objective:   Physical Exam  Constitutional: She is oriented to person, place, and time.  Cardiovascular: Normal rate, regular rhythm and normal heart sounds.  Pulmonary/Chest: Effort normal and breath sounds normal.  Neurological: She is alert and oriented to person, place, and time.    BP 128/85   Pulse 65   Temp 98 F (36.7 C)   Wt 187 lb 14.4 oz (85.2 kg)   LMP  (LMP Unknown)   BMI 30.33 kg/m        Assessment & Plan:   Medications have been removed from medication list  due to patient not taking them for a while. Results show that pt does not need those medications at this time (lipitor, plavix, potassium chloride).  F/u in 6 months with labs a week prior: CMET, CBC, lipid panel, UA, TSH

## 2017-09-13 ENCOUNTER — Encounter (INDEPENDENT_AMBULATORY_CARE_PROVIDER_SITE_OTHER): Payer: Self-pay

## 2017-09-13 ENCOUNTER — Ambulatory Visit: Payer: No Typology Code available for payment source | Admitting: Pharmacy Technician

## 2017-09-13 DIAGNOSIS — Z79899 Other long term (current) drug therapy: Secondary | ICD-10-CM

## 2017-09-13 NOTE — Progress Notes (Signed)
Met with patient completed financial assistance application for [place] due to recent ED visit.  Patient agreed to be responsible for gathering financial information and forwarding to appropriate department in [facility].    Completed [facility] application and contract.  Patient agreed to all terms of the [facility] contract.    Patient approved to receive medication assistance at [facility] through 2019, as long as eligibility criteria continues to be met.    Provided patient with community resource material based on her particular needs.    Assisted patient with making [facility] appt on September 19, 2017 at 11:30a.m.  Provided patient with LINK transportation tickets to get to and from [employer].  Referred patient for MTM.  [facility]

## 2017-09-19 ENCOUNTER — Ambulatory Visit: Payer: Self-pay

## 2017-10-19 ENCOUNTER — Ambulatory Visit: Payer: Self-pay | Attending: Oncology | Admitting: *Deleted

## 2017-10-19 ENCOUNTER — Ambulatory Visit
Admission: RE | Admit: 2017-10-19 | Discharge: 2017-10-19 | Disposition: A | Discharge status: Home / Self Care | Payer: Self-pay | Source: Ambulatory Visit | Attending: Oncology | Admitting: Oncology

## 2017-10-19 VITALS — BP 125/52 | HR 75 | Temp 98.2°F | Ht 67.0 in | Wt 187.0 lb

## 2017-10-19 DIAGNOSIS — Z0000 Encounter for general adult medical examination without abnormal findings: Secondary | ICD-10-CM | POA: Insufficient documentation

## 2017-10-19 NOTE — Progress Notes (Addendum)
Subjective:     Patient ID: Meredith Juarez, female   DOB: [birth year], 57 y.o.   MRN: [id number]  HPI   Review of Systems     Objective:   Physical Exam  Pulmonary/Chest: Right breast exhibits no inverted nipple, no mass, no nipple discharge, no skin change and no tenderness. Left breast exhibits no inverted nipple, no mass, no nipple discharge, no skin change and no tenderness. Breasts are asymmetrical.  Right breast larger than the left  Abdominal: There is no splenomegaly or hepatomegaly.  Genitourinary: Rectal exam shows external hemorrhoid.    No labial fusion. There is lesion on the right labia. There is no rash, tenderness or injury on the right labia. There is no rash, tenderness, lesion or injury on the left labia. Cervix exhibits no motion tenderness, no discharge and no friability. Right adnexum displays no mass, no tenderness and no fullness. Left adnexum displays no mass, no tenderness and no fullness. There is bleeding in the vagina. No erythema or tenderness in the vagina. No foreign body in the vagina. No signs of injury around the vagina. No vaginal discharge found.       Assessment:     57 year old Black female referred to [facility] by Medication Management for clinical breast exam, pap, mammogram, and evaluation of a history of post menopausal bleeding.  Clinical breast exam unremarkable.  Patient is unable to raise her right arm during the exam.  States she has right shoulder problems.  Taught self breast awareness.  On physical exam of the abdomen, the patient haves a large abdomen, but I thought I could possibly palpate an enlarged uterus.  Specimen collected for pap smear.  On visual inspection there is an approximate 2 cm soft, mobile nodule at the right labia.  Non-tender to palpation. There is also blood noted in the vagina.  On bimanual exam, the cervix is non-mobile and full. Patient states she has had intermittent periods of vaginal bleeding since October 2018.  States  she stopped having her menstrual cycle at age 20.  Family history includes her mom with breast cancer at age 47, uterine cancer age unknown, and stomach cancer.  States she has a lot of other cancers in her family, but does know who or what type.  Patient has been screened for eligibility.  She does not have any insurance, Medicare or Medicaid.  She also meets financial eligibility.  Hand-out given on the Affordable Care Act.    Plan:      Screening mammogram ordered.  Specimen for pap sent to the lab.  Will refer patient to GYN for further evaluation of the post menopausal bleeding and full heavy cervix.  Explained to the patient that we could pay for the consult, but if further evaluation is needed with ultrasound or biopsy we would have to have her complete patient financial forms.  Asked patient if she had completed the forms given to her by [facility], and she states no.  Patient financial forms given to the patient by Jeanella Anton.  Will follow-up per [facility] protocol.

## 2017-10-19 NOTE — Patient Instructions (Signed)
HPV Test The human papillomavirus (HPV) test is used to look for high-risk types of HPV infection. HPV is a group of about 100 viruses. Many of these viruses cause growths on, in, or around the genitals. Most HPV viruses cause infections that usually go away without treatment. However, HPV types 6, 11, 16, and 18 are considered high-risk types of HPV that can increase your risk of cancer of the cervix or anus if the infection is left untreated. An HPV test identifies the DNA (genetic) strands of the HPV infection, so it is also referred to as the HPV DNA test. Although HPV is found in both males and females, the HPV test is only used to screen for increased cancer risk in females:  With an abnormal Pap test.  After treatment of an abnormal Pap test.  Between the ages of 30 and 65.  After treatment of a high-risk HPV infection.  The HPV test may be done at the same time as a pelvic exam and Pap test in females over the age of 30. Both the HPV test and Pap test require a sample of cells from the cervix. How do I prepare for this test?  Do not douche or take a bath for 24-48 hours before the test or as directed by your health care provider.  Do not have sex for 24-48 hours before the test or as directed by your health care provider.  You may be asked to reschedule the test if you are menstruating.  You will be asked to urinate before the test. What do the results mean? It is your responsibility to obtain your test results. Ask the lab or department performing the test when and how you will get your results. Talk with your health care provider if you have any questions about your results. Your result will be negative or positive. Meaning of Negative Test Results A negative HPV test result means that no HPV was found, and it is very likely that you do not have HPV. Meaning of Positive Test Results A positive HPV test result indicates that you have HPV.  If your test result shows the presence  of any high-risk HPV strains, you may have an increased risk of developing cancer of the cervix or anus if the infection is left untreated.  If any low-risk HPV strains are found, you are not likely to have an increased risk of cancer.  Discuss your test results with your health care provider. He or she will use the results to make a diagnosis and determine a treatment plan that is right for you. Talk with your health care provider to discuss your results, treatment options, and if necessary, the need for more tests. Talk with your health care provider if you have any questions about your results. This information is not intended to replace advice given to you by your health care provider. Make sure you discuss any questions you have with your health care provider. Document Released: 07/09/2004 Document Revised: 02/18/2016 Document Reviewed: 10/30/2013 Elsevier Interactive Patient Education  2018 [employer] Inc[employer]   Gave patient hand-out, Women Staying Healthy, Active and Well from [facility], with education on breast health, pap smears, heart and colon health.  

## 2017-10-24 ENCOUNTER — Telehealth: Payer: Self-pay | Admitting: *Deleted

## 2017-10-24 LAB — PAP LB AND HPV HIGH-RISK
HPV, HIGH-RISK: NEGATIVE
PAP SMEAR COMMENT: 0

## 2017-10-24 NOTE — Telephone Encounter (Signed)
Called patient to inform her of her results of her normal pap smear, and need for treatment of Trichomonas.  Patient states "how did I get that, I don't even have a boyfriend."  States she has not had sex, or used sex toys in years.  Informed I would mail her a hand-out on Trichomonas.  [place] Mow cab arranged for transportation to and from her gyn appointment with Dr. Gilman Schmidt at [place].  Asked patient to discuss Trichomonas with her also.  Prescription for Metronidazole 1 gram by mouth once, called into  Medication Management per protocol, for treatment of Trichomonas.  Patient encouraged not to drink any alcohol with this med.  She is agreeable.

## 2017-11-08 ENCOUNTER — Other Ambulatory Visit: Payer: Self-pay | Admitting: Obstetrics and Gynecology

## 2017-11-08 ENCOUNTER — Ambulatory Visit (INDEPENDENT_AMBULATORY_CARE_PROVIDER_SITE_OTHER): Payer: Self-pay | Admitting: Obstetrics and Gynecology

## 2017-11-08 ENCOUNTER — Ambulatory Visit (INDEPENDENT_AMBULATORY_CARE_PROVIDER_SITE_OTHER): Payer: Self-pay

## 2017-11-08 ENCOUNTER — Encounter: Payer: Self-pay | Admitting: Obstetrics and Gynecology

## 2017-11-08 VITALS — BP 148/98 | Ht 66.0 in | Wt 191.0 lb

## 2017-11-08 DIAGNOSIS — N950 Postmenopausal bleeding: Secondary | ICD-10-CM

## 2017-11-08 NOTE — Progress Notes (Signed)
   Patient ID: Meredith Juarez, female   DOB: [birth year], 57 y.o.   MRN: [id number]  Reason for Consult: Vaginal Bleeding   Referred by No ref. provider found  Subjective:     HPI:  Meredith Juarez is a 57 y.o. female. She reports postmenopausal bleeding   Past Medical History:  Diagnosis Date  . Anemia   . Depression   . Diverticulitis   . GERD (gastroesophageal reflux disease)   . Hypertension   . Migraines   . Stroke ([place])   . Thyroid disease    Family History  Problem Relation Age of Onset  . Cancer Mother        Uterine, Breast, Ovarian  . Hypertension Mother   . Breast cancer Mother   . Hypertension Father   . Bipolar disorder Father   . CAD Sister   . Hyperlipidemia Sister    Past Surgical History:  Procedure Laterality Date  . NO PAST SURGERIES      Short Social History:  Social History   Tobacco Use  . Smoking status: Current Every Day Smoker    Packs/day: 0.25    Types: Cigarettes  . Smokeless tobacco: Never Used  Substance Use Topics  . Alcohol use: Yes    Comment: Ocassionally    No Known Allergies  Current Outpatient Medications  Medication Sig Dispense Refill  . acetaminophen (TYLENOL) 500 MG tablet Take 500 mg by mouth every 4 (four) hours as needed.    Marland Kitchen amLODipine (NORVASC) 10 MG tablet Take 1 tablet (10 mg total) by mouth daily. 90 tablet 3  . aspirin EC 81 MG tablet Take 1 tablet (81 mg total) by mouth daily. 90 tablet 3  . hydrochlorothiazide (HYDRODIURIL) 25 MG tablet Take 1 tablet (25 mg total) by mouth daily. 90 tablet 3  . ibuprofen (ADVIL,MOTRIN) 600 MG tablet Take 1 tablet (600 mg total) by mouth every 8 (eight) hours as needed. 30 tablet 0  . metoprolol tartrate (LOPRESSOR) 50 MG tablet Take 1 tablet (50 mg total) by mouth daily. 90 tablet 3  . nitroGLYCERIN (NITROSTAT) 0.4 MG SL tablet Place 1 tablet (0.4 mg total) under the tongue every 5 (five) minutes as needed for chest pain. 25 tablet 0  . polyethylene glycol (MIRALAX) packet  Take 17 g by mouth 2 (two) times daily. 14 each 0  . QUEtiapine (SEROQUEL) 200 MG tablet Take 1 tablet (200 mg total) by mouth at bedtime. 30 tablet 1  . sertraline (ZOLOFT) 100 MG tablet Take 1 tablet (100 mg total) by mouth daily. 30 tablet 1  . traZODone (DESYREL) 100 MG tablet Take 1 tablet (100 mg total) by mouth at bedtime. 1/2 - 1 tablet as needed 30 tablet 1   No current facility-administered medications for this visit.     REVIEW OF SYSTEMS      Objective:  Objective   Vitals:   11/08/17 0952  BP: (!) 148/98  Weight: 191 lb (86.6 kg)  Height: 5\' 6"  (1.676 m)   Body mass index is 30.83 kg/m.  Physical Exam  Endometrial biopsy performed     Assessment/Plan:     57 yo with postmenopausal bleeding Endometrial biopsy today Patient will follow up for vaginal [place]  Woodford Strege MD [place], [facility] 11/27/17 6:15 PM

## 2017-11-17 ENCOUNTER — Encounter: Payer: Self-pay | Admitting: *Deleted

## 2017-11-17 NOTE — Progress Notes (Addendum)
Patient saw Dr. Gilman Schmidt on 11/08/17.  Pelvic ultrasound noted multiple fibroids.  Patient also had an endometrial biopsy.  Results are still pending.  She is to follow up with [facility] in one year for annual screening mammogram.  She is to follow-up as recommended by Dr. Gilman Schmidt in regards to her pelvic ultrasound and biopsy.  [facility] to [place].

## 2017-12-28 ENCOUNTER — Other Ambulatory Visit: Payer: Self-pay

## 2017-12-28 DIAGNOSIS — Z0000 Encounter for general adult medical examination without abnormal findings: Secondary | ICD-10-CM

## 2017-12-29 LAB — URINALYSIS
Bilirubin, UA: NEGATIVE
Glucose, UA: NEGATIVE
NITRITE UA: NEGATIVE
Specific Gravity, UA: 1.02 (ref 1.005–1.030)
UUROB: 0.2 mg/dL (ref 0.2–1.0)
pH, UA: 5 (ref 5.0–7.5)

## 2017-12-29 LAB — COMPREHENSIVE METABOLIC PANEL
ALBUMIN: 4.2 g/dL (ref 3.5–5.5)
ALT: 10 IU/L (ref 0–32)
AST: 10 IU/L (ref 0–40)
Albumin/Globulin Ratio: 1.8 (ref 1.2–2.2)
Alkaline Phosphatase: 80 IU/L (ref 39–117)
BILIRUBIN TOTAL: 0.3 mg/dL (ref 0.0–1.2)
BUN / CREAT RATIO: 11 (ref 9–23)
BUN: 7 mg/dL (ref 6–24)
CALCIUM: 9.4 mg/dL (ref 8.7–10.2)
CO2: 20 mmol/L (ref 20–29)
CREATININE: 0.63 mg/dL (ref 0.57–1.00)
Chloride: 108 mmol/L — ABNORMAL HIGH (ref 96–106)
GFR calc non Af Amer: 101 mL/min/{1.73_m2} (ref >59)
GFR, EST AFRICAN AMERICAN: 116 mL/min/{1.73_m2} (ref >59)
Globulin, Total: 2.4 g/dL (ref 1.5–4.5)
Glucose: 109 mg/dL — ABNORMAL HIGH (ref 65–99)
Potassium: 3.1 mmol/L — ABNORMAL LOW (ref 3.5–5.2)
Sodium: 143 mmol/L (ref 134–144)
Total Protein: 6.6 g/dL (ref 6.0–8.5)

## 2017-12-29 LAB — CBC
Hematocrit: 41.6 % (ref 34.0–46.6)
Hemoglobin: 13.4 g/dL (ref 11.1–15.9)
MCH: 25.9 pg — ABNORMAL LOW (ref 26.6–33.0)
MCHC: 32.2 g/dL (ref 31.5–35.7)
MCV: 81 fL (ref 79–97)
PLATELETS: 369 10*3/uL (ref 150–450)
RBC: 5.17 x10E6/uL (ref 3.77–5.28)
RDW: 13.7 % (ref 12.3–15.4)
WBC: 6.9 10*3/uL (ref 3.4–10.8)

## 2017-12-29 LAB — LIPID PANEL
CHOL/HDL RATIO: 4 ratio (ref 0.0–4.4)
Cholesterol, Total: 139 mg/dL (ref 100–199)
HDL: 35 mg/dL — AB (ref >39)
LDL Calculated: 58 mg/dL (ref 0–99)
TRIGLYCERIDES: 231 mg/dL — AB (ref 0–149)
VLDL CHOLESTEROL CAL: 46 mg/dL — AB (ref 5–40)

## 2017-12-29 LAB — TSH: TSH: 2.51 u[IU]/mL (ref 0.450–4.500)

## 2018-01-04 ENCOUNTER — Ambulatory Visit: Payer: Medicaid Other | Admitting: Internal Medicine

## 2018-01-04 ENCOUNTER — Encounter: Payer: Self-pay | Admitting: Internal Medicine

## 2018-01-04 VITALS — BP 134/88 | HR 84 | Temp 98.6°F | Wt 188.3 lb

## 2018-01-04 DIAGNOSIS — I10 Essential (primary) hypertension: Secondary | ICD-10-CM

## 2018-01-04 MED ORDER — POTASSIUM CHLORIDE ER 10 MEQ PO TBCR: EXTENDED_RELEASE_TABLET | ORAL | 1 refills | Status: DC

## 2018-01-04 NOTE — Progress Notes (Signed)
Subjective:    Patient ID: Meredith Juarez, female    DOB: 1961/02/18, 57 y.o.   MRN: [id number]  HPI  Pt still having er vaginal bleeding fairly frequently she does not appear to be expereincing anemia at this time.   At her visit to her GYN ([name]) she reports she was told that she may need an MRI of her uterus since they were unable to complete some type of uterus examination during her visit.   In reviewing the GYN note, it does not appear to be completed and there is no reference to an MRI.   Reviewing her lab data found that her Potassium is low and she does take a diuretic for BP which looks good today.     Review of Systems Patient Active Problem List   Diagnosis Date Noted  . Arthritis of ankle 12/28/2016  . Shoulder arthritis 12/28/2016  . H/O: stroke 12/27/2016  . Right sided weakness 12/27/2016  . Depression 12/27/2016  . GERD (gastroesophageal reflux disease) 11/07/2016  . Tobacco use disorder 11/07/2016  . Alcohol use disorder, severe, dependence ([place]) 11/07/2016  . Severe episode of recurrent major depressive disorder, without psychotic features ([place])   . Migraines 09/05/2014  . Hypertension 01/01/2014   Allergies as of 01/04/2018   No Known Allergies     Medication List        Accurate as of 01/04/18  9:12 AM. Always use your most recent med list.          acetaminophen 500 MG tablet Commonly known as:  TYLENOL Take 500 mg by mouth every 4 (four) hours as needed.   amLODipine 10 MG tablet Commonly known as:  NORVASC Take 1 tablet (10 mg total) by mouth daily.   aspirin EC 81 MG tablet Take 1 tablet (81 mg total) by mouth daily.   hydrochlorothiazide 25 MG tablet Commonly known as:  HYDRODIURIL Take 1 tablet (25 mg total) by mouth daily.   ibuprofen 600 MG tablet Commonly known as:  ADVIL,MOTRIN Take 1 tablet (600 mg total) by mouth every 8 (eight) hours as needed.   metoprolol tartrate 50 MG tablet Commonly known as:   LOPRESSOR Take 1 tablet (50 mg total) by mouth daily.   nitroGLYCERIN 0.4 MG SL tablet Commonly known as:  NITROSTAT Place 1 tablet (0.4 mg total) under the tongue every 5 (five) minutes as needed for chest pain.   polyethylene glycol packet Commonly known as:  MIRALAX Take 17 g by mouth 2 (two) times daily.   QUEtiapine 200 MG tablet Commonly known as:  SEROQUEL Take 1 tablet (200 mg total) by mouth at bedtime.   sertraline 100 MG tablet Commonly known as:  ZOLOFT Take 1 tablet (100 mg total) by mouth daily.   traZODone 100 MG tablet Commonly known as:  DESYREL Take 1 tablet (100 mg total) by mouth at bedtime. 1/2 - 1 tablet as needed          Objective:   Physical Exam  Constitutional: She is oriented to person, place, and time.  Cardiovascular: Normal rate, regular rhythm and normal heart sounds.  Pulmonary/Chest: Effort normal and breath sounds normal.  Neurological: She is alert and oriented to person, place, and time.    BP 134/88 (BP Location: Left Arm, Patient Position: Sitting)   Pulse 84   Temp 98.6 F (37 C) (Oral)   Wt 188 lb 4.8 oz (85.4 kg)   LMP  (LMP Unknown)   BMI 30.39 kg/m  Assessment & Plan:   Plan is to  1) Contact GYN for update of May note and seek clarification on the need for an MRI.    2) Supplement Potassium loss with K-Dur 10 meq for next 3 mo and FU with labs   3) The abnormal elevated TSH seen a year ago appears to have corrected itself.   Return in 3 months with labs a week prior.

## 2018-02-09 ENCOUNTER — Encounter: Payer: Self-pay | Admitting: Emergency Medicine

## 2018-02-09 ENCOUNTER — Emergency Department
Admission: EM | Admit: 2018-02-09 | Discharge: 2018-02-09 | Disposition: A | Discharge status: Home / Self Care | Payer: Self-pay | Attending: Emergency Medicine | Admitting: Emergency Medicine

## 2018-02-09 ENCOUNTER — Emergency Department: Payer: Self-pay

## 2018-02-09 ENCOUNTER — Other Ambulatory Visit: Payer: Self-pay

## 2018-02-09 DIAGNOSIS — K6289 Other specified diseases of anus and rectum: Secondary | ICD-10-CM | POA: Insufficient documentation

## 2018-02-09 DIAGNOSIS — Z79899 Other long term (current) drug therapy: Secondary | ICD-10-CM | POA: Insufficient documentation

## 2018-02-09 DIAGNOSIS — Z7982 Long term (current) use of aspirin: Secondary | ICD-10-CM | POA: Insufficient documentation

## 2018-02-09 DIAGNOSIS — I10 Essential (primary) hypertension: Secondary | ICD-10-CM | POA: Insufficient documentation

## 2018-02-09 DIAGNOSIS — F17210 Nicotine dependence, cigarettes, uncomplicated: Secondary | ICD-10-CM | POA: Insufficient documentation

## 2018-02-09 DIAGNOSIS — K623 Rectal prolapse: Secondary | ICD-10-CM

## 2018-02-09 HISTORY — DX: Unspecified osteoarthritis, unspecified site: M19.90

## 2018-02-09 LAB — BASIC METABOLIC PANEL WITH GFR
Anion gap: 6 (ref 5–15)
BUN: 9 mg/dL (ref 6–20)
CO2: 25 mmol/L (ref 22–32)
Calcium: 9.1 mg/dL (ref 8.9–10.3)
Chloride: 107 mmol/L (ref 98–111)
Creatinine, Ser: 0.58 mg/dL (ref 0.44–1.00)
GFR calc Af Amer: >60 mL/min
GFR calc non Af Amer: >60 mL/min
Glucose, Bld: 96 mg/dL (ref 70–99)
Potassium: 2.8 mmol/L — ABNORMAL LOW (ref 3.5–5.1)
Sodium: 138 mmol/L (ref 135–145)

## 2018-02-09 LAB — CBC WITH DIFFERENTIAL/PLATELET
Basophils Absolute: 0.1 10*3/uL (ref 0–0.1)
Basophils Relative: 1 %
EOS ABS: 0.1 10*3/uL (ref 0–0.7)
EOS PCT: 1 %
HCT: 40.7 % (ref 35.0–47.0)
Hemoglobin: 13.5 g/dL (ref 12.0–16.0)
Lymphocytes Relative: 16 %
Lymphs Abs: 1.8 10*3/uL (ref 1.0–3.6)
MCH: 26.5 pg (ref 26.0–34.0)
MCHC: 33.1 g/dL (ref 32.0–36.0)
MCV: 80.1 fL (ref 80.0–100.0)
MONO ABS: 0.9 10*3/uL (ref 0.2–0.9)
Monocytes Relative: 8 %
Neutro Abs: 8.7 10*3/uL — ABNORMAL HIGH (ref 1.4–6.5)
Neutrophils Relative %: 74 %
PLATELETS: 296 10*3/uL (ref 150–440)
RBC: 5.08 MIL/uL (ref 3.80–5.20)
RDW: 16.3 % — AB (ref 11.5–14.5)
WBC: 11.5 10*3/uL — AB (ref 3.6–11.0)

## 2018-02-09 MED ORDER — IOPAMIDOL (ISOVUE-300) INJECTION 61%
100.0000 mL | Freq: Once | INTRAVENOUS | Status: AC | PRN
  Administered 2018-02-09: 100 mL via INTRAVENOUS
  Filled 2018-02-09: qty 100

## 2018-02-09 MED ORDER — LIDOCAINE HCL (PF) 1 % IJ SOLN
10.0000 mL | Freq: Once | INTRAMUSCULAR | Status: AC
  Administered 2018-02-09: 10 mL
  Filled 2018-02-09: qty 10

## 2018-02-09 MED ORDER — CLINDAMYCIN HCL 300 MG PO CAPS: 300.0000 mg | ORAL_CAPSULE | Freq: Three times a day (TID) | ORAL | 0 refills | Status: AC

## 2018-02-09 MED ORDER — DIBUCAINE 1 % RE OINT: 1.0000 "application " | TOPICAL_OINTMENT | RECTAL | 0 refills | Status: DC | PRN

## 2018-02-09 MED ORDER — HYDROCORTISONE ACETATE 25 MG RE SUPP
25.0000 mg | Freq: Once | RECTAL | Status: AC
  Administered 2018-02-09: 25 mg via RECTAL
  Filled 2018-02-09: qty 1

## 2018-02-09 MED ORDER — BENZOCAINE 20 % MT SOLN
1.0000 "application " | Freq: Once | OROMUCOSAL | Status: AC
  Administered 2018-02-09: 1 via OROMUCOSAL

## 2018-02-09 MED ORDER — PENTAFLUOROPROP-TETRAFLUOROETH EX AERO
1.0000 "application " | INHALATION_SPRAY | CUTANEOUS | Status: DC | PRN
  Administered 2018-02-09 (×2): 1 via TOPICAL

## 2018-02-09 MED ORDER — PENTAFLUOROPROP-TETRAFLUOROETH EX AERO
INHALATION_SPRAY | CUTANEOUS | Status: AC
  Administered 2018-02-09: 1 via TOPICAL
  Filled 2018-02-09: qty 30

## 2018-02-09 MED ORDER — LIDOCAINE HCL URETHRAL/MUCOSAL 2 % EX GEL
1.0000 "application " | Freq: Once | CUTANEOUS | Status: AC
  Administered 2018-02-09: 1
  Filled 2018-02-09: qty 10

## 2018-02-09 MED ORDER — HYDROCORTISONE ACETATE 25 MG RE SUPP: 25.0000 mg | Freq: Two times a day (BID) | RECTAL | 1 refills | Status: DC

## 2018-02-09 MED ORDER — PEG 3350 17 GM/SCOOP PO POWD: 17.0000 g | Freq: Every day | ORAL | 0 refills | Status: AC

## 2018-02-09 MED ORDER — TRAMADOL HCL 50 MG PO TABS
50.0000 mg | ORAL_TABLET | Freq: Once | ORAL | Status: AC
  Administered 2018-02-09: 50 mg via ORAL
  Filled 2018-02-09: qty 1

## 2018-02-09 MED ORDER — TRAMADOL HCL 50 MG PO TABS: 50.0000 mg | ORAL_TABLET | Freq: Two times a day (BID) | ORAL | 0 refills | Status: AC

## 2018-02-09 MED ORDER — CLINDAMYCIN HCL 150 MG PO CAPS
300.0000 mg | ORAL_CAPSULE | Freq: Once | ORAL | Status: AC
  Administered 2018-02-09: 300 mg via ORAL
  Filled 2018-02-09: qty 2

## 2018-02-09 MED ORDER — HYDROCORTISONE ACETATE 25 MG RE SUPP: 25.0000 mg | Freq: Two times a day (BID) | RECTAL | 1 refills | Status: AC

## 2018-02-09 MED ORDER — BENZOCAINE 20 % MT SOLN
OROMUCOSAL | Status: AC
  Filled 2018-02-09: qty 5

## 2018-02-09 NOTE — Consult Note (Addendum)
SURGICAL CONSULTATION NOTE (initial) - cpt: 99244  HISTORY OF PRESENT ILLNESS (HPI):  57 y.o. female presented to [facility] ED today for evaluation of rectal pain and swelling. Patient reports she's had a long history of chronic constipation, for which she frequently takes laxatives (of which she does not recall their name) following a stroke with residual Right-sided weakness. Patient says she often won't pass a BM for several days. Three days ago, she took an unknown laxative and, after straining to pass a "very large" stool, she noted increased peri-anal "swelling". Patient says she has experienced intermittent anal pain, swelling, and periodic spotting of blood, but says the swelling for which she presents seems larger to her this time than prior episodes. She describes +flatus and moderate peri-anal "discomfort" with mild peri-anal pain, denies abdominal distention, N/V, fever/chills, CP, or SOB.  Surgery is consulted by ED PA Menshew, supervised by ED physician Dr. Cinda Quest, in this context for evaluation and management of rectal prolapse.  PAST MEDICAL HISTORY (PMH):  Past Medical History:  Diagnosis Date  . Anemia   . Arthritis   . Depression   . Diverticulitis   . GERD (gastroesophageal reflux disease)   . Hypertension   . Migraines   . Stroke ([place])   . Thyroid disease      PAST SURGICAL HISTORY ([place]):  Past Surgical History:  Procedure Laterality Date  . NO PAST SURGERIES       MEDICATIONS:  Prior to Admission medications   Medication Sig Start Date End Date Taking? Authorizing Provider  acetaminophen (TYLENOL) 500 MG tablet Take 500 mg by mouth every 4 (four) hours as needed.    [provider]  amLODipine (NORVASC) 10 MG tablet Take 1 tablet (10 mg total) by mouth daily. 06/08/17   Tawni Millers, MD  aspirin EC 81 MG tablet Take 1 tablet (81 mg total) by mouth daily. 06/08/17   Tawni Millers, MD  hydrochlorothiazide (HYDRODIURIL) 25 MG tablet Take 1 tablet (25 mg  total) by mouth daily. 06/08/17   Tawni Millers, MD  ibuprofen (ADVIL,MOTRIN) 600 MG tablet Take 1 tablet (600 mg total) by mouth every 8 (eight) hours as needed. 11/11/16   Darel Hong, MD  metoprolol tartrate (LOPRESSOR) 50 MG tablet Take 1 tablet (50 mg total) by mouth daily. 06/08/17 06/08/18  Tawni Millers, MD  nitroGLYCERIN (NITROSTAT) 0.4 MG SL tablet Place 1 tablet (0.4 mg total) under the tongue every 5 (five) minutes as needed for chest pain. 11/09/16   Pucilowska, Jolanta B, MD  polyethylene glycol (MIRALAX) packet Take 17 g by mouth 2 (two) times daily. Patient not taking: Reported on 01/04/2018 01/17/17   Gregor Hams, MD  potassium chloride (K-DUR) 10 MEQ tablet Take 2 tablets once daily 01/04/18   Tawni Millers, MD  QUEtiapine (SEROQUEL) 200 MG tablet Take 1 tablet (200 mg total) by mouth at bedtime. 11/09/16   Pucilowska, Herma Ard B, MD  sertraline (ZOLOFT) 100 MG tablet Take 1 tablet (100 mg total) by mouth daily. 11/09/16   Pucilowska, Herma Ard B, MD  traZODone (DESYREL) 100 MG tablet Take 1 tablet (100 mg total) by mouth at bedtime. 1/2 - 1 tablet as needed 11/09/16   Pucilowska, Jolanta B, MD     ALLERGIES:  No Known Allergies   SOCIAL HISTORY:  Social History   Socioeconomic History  . Marital status: Single    Spouse name: Not on file  . Number of children: Not on file  . Years of  education: Not on file  . Highest education level: Not on file  Occupational History  . Not on file  Social Needs  . Financial resource strain: Somewhat hard  . Food insecurity:    Worry: Sometimes true    Inability: Sometimes true  . Transportation needs:    Medical: Yes    Non-medical: Yes  Tobacco Use  . Smoking status: Current Every Day Smoker    Packs/day: 0.25    Types: Cigarettes  . Smokeless tobacco: Never Used  Substance and Sexual Activity  . Alcohol use: Yes    Comment: Ocassionally  . Drug use: No  . Sexual activity: Not Currently  Lifestyle  . Physical  activity:    Days per week: 0 days    Minutes per session: 0 min  . Stress: Rather much  Relationships  . Social connections:    Talks on phone: Never    Gets together: Never    Attends religious service: Never    Active member of club or organization: No    Attends meetings of clubs or organizations: Never    Relationship status: Not on file  . Intimate partner violence:    Fear of current or ex partner: No    Emotionally abused: No    Physically abused: No    Forced sexual activity: No  Other Topics Concern  . Not on file  Social History Narrative   Gets foodstamps and they help a lot       Hard time to get to bus and then paying for bus a big trouble       strees and depression      7/10 - Patient signed inform consent to participate in possible referral for transportation help    The patient currently resides (home / rehab facility / nursing home): Home The patient normally is (ambulatory / bedbound): Ambulatory (though with chronic post-stroke Right-sided weakness)  FAMILY HISTORY:  Family History  Problem Relation Age of Onset  . Cancer Mother        Uterine, Breast, Ovarian  . Hypertension Mother   . Breast cancer Mother   . Hypertension Father   . Bipolar disorder Father   . CAD Sister   . Hyperlipidemia Sister      REVIEW OF SYSTEMS:  Constitutional: denies weight loss, fever, chills, or sweats  Eyes: denies any other vision changes, history of eye injury  ENT: denies sore throat, hearing problems  Respiratory: denies shortness of breath, wheezing  Cardiovascular: denies chest pain, palpitations  Gastrointestinal: abdominal pain, N/V, and bowel function as per HPI Genitourinary: denies burning with urination or urinary frequency Musculoskeletal: denies any other joint pains or cramps  Skin: denies any other rashes or skin discolorations  Neurological: denies any other headache, dizziness, weakness  Psychiatric: denies any other depression, anxiety    All other review of systems were negative   VITAL SIGNS:  Temp:  [98.9 F (37.2 C)] 98.9 F (37.2 C) (08/15 1149) Pulse Rate:  [97] 97 (08/15 1430) Resp:  [18] 18 (08/15 1430) BP: (120-129)/(70-72) 120/70 (08/15 1430) SpO2:  [98 %] 98 % (08/15 1430) Weight:  [81.6 kg] 81.6 kg (08/15 1147)     Height: 5\' 6"  (167.6 cm) Weight: 81.6 kg BMI (Calculated): 29.07   INTAKE/OUTPUT:  This shift: No intake/output data recorded.  Last 2 shifts: @IOLAST2SHIFTS @   PHYSICAL EXAM:  Constitutional:  -- Normal body habitus  -- Awake, alert, and oriented x3, no apparent distress Eyes:  -- Pupils  equally round and reactive to light  -- No scleral icterus, B/L no occular discharge Ear, nose, throat: -- Neck is FROM WNL -- No jugular venous distension  Pulmonary:  -- No wheezes or rhales -- Equal breath sounds bilaterally -- Breathing non-labored at rest Cardiovascular:  -- S1, S2 present  -- No pericardial rubs  Gastrointestinal:  -- Abdomen soft, nontender, non-distended, no guarding or rebound tenderness -- No abdominal masses appreciated, pulsatile or otherwise  Anorectal: -- Non-circumferential, non-reducible, non-bleeding minimally tender 3 cm long anorectal tissue prolapsed/protruding from 7 o'clock position with what appears to be mucosal fissures x 2 Musculoskeletal and Integumentary:  -- Wounds or skin discoloration: None appreciated except as described above (anorectal) -- Extremities: B/L UE and LE FROM, hands and feet warm, no edema  Neurologic:  -- Motor function: Intact and symmetric, though RUE and RLE noticeably weaker than LUE and LLE -- Sensation: Intact and symmetric Psychiatric:  -- Mood and affect WNL  Labs:  CBC Latest Ref Rng & Units 02/09/2018 12/28/2017 05/12/2017  WBC 3.6 - 11.0 K/uL 11.5(H) 6.9 6.5  Hemoglobin 12.0 - 16.0 g/dL 13.5 13.4 15.9  Hematocrit 35.0 - 47.0 % 40.7 41.6 48.0(H)  Platelets 150 - 440 K/uL 296 369 271   CMP Latest Ref Rng & Units  02/09/2018 12/28/2017 05/12/2017  Glucose 70 - 99 mg/dL 96 109(H) 90  BUN 6 - 20 mg/dL 9 7 10   Creatinine 0.44 - 1.00 mg/dL 0.58 0.63 0.57  Sodium 135 - 145 mmol/L 138 143 138  Potassium 3.5 - 5.1 mmol/L 2.8(L) 3.1(L) 3.7  Chloride 98 - 111 mmol/L 107 108(H) 106  CO2 22 - 32 mmol/L 25 20 23   Calcium 8.9 - 10.3 mg/dL 9.1 9.4 9.5  Total Protein 6.0 - 8.5 g/dL - 6.6 -  Total Bilirubin 0.0 - 1.2 mg/dL - 0.3 -  Alkaline Phos 39 - 117 IU/L - 80 -  AST 0 - 40 IU/L - 10 -  ALT 0 - 32 IU/L - 10 -   Imaging studies:  CT Pelvis with contrast (02/09/2018) - personally reviewed and discussed with ED PA and patient Suspected rectal prolapse. Sigmoid diverticulosis without evidence of diverticulitis. Enlarged uterus containing multiple, some calcified some  exophytic masses, causing external compression of urinary bladder.   Assessment/Plan: (ICD-10's: K21.3) 57 y.o. female with acute on chronic mildly symptomatic rectal prolapse vs less likely prolapsed large non-thrombosed hemorrhoid with ongoing flatus and chronic intermittent constipation without evidence of ischemia, radiographic evidence of obstruction, or significant bleeding, complicated by pertinent comorbidities including HTN, chronic Right-sided weakness s/p stroke, thyroid disease (unspecified), GERD, migraine headaches, osteoarthritis, history of alcohol abuse/dependence, and major depression disorder.   - topical lidocaine cream and Anusol cream  - apply ice to reduce inflammation/swelling and sitz baths prn for pain relief  - maintain hydration, Colace stool softener once - twice daily (reduce if loose BM's) +/- Miralax prn for constipation  - close outpatient colorectal surgical evaluation, return precautions if persistent constipation, abdominal distention, N/V, bleeding, or worsened pain  - outpatient follow-up with gynecology, already following for uterine masses/fibroids  - medical management of comorbidities, smoking cessation  All  of the above findings and recommendations were discussed with the patient and ED PA, and all of patient's questions were answered to her expressed satisfaction.  Thank you for the opportunity to participate in this patient's care.   -- Marilynne Drivers Rosana Hoes, MD, [place]: [facility] - Partnering for exceptional care. Office: [phone]

## 2018-02-09 NOTE — ED Triage Notes (Signed)
Presents via ems with possible abscess areas to vagina and rectal area  States she noticed areas about 2 weeks ago

## 2018-02-09 NOTE — ED Provider Notes (Signed)
[facility] Emergency Department Provider Note ____________________________________________  Time seen: 1246  I have reviewed the triage vital signs and the nursing notes.  HISTORY  Chief Complaint  Abscess  HPI Meredith Juarez is a 57 y.o. female presents to the ED for evaluation of intermittent rectal pain for the last 2 weeks. She notes acutely worsening rectal pain for the last 3-4 days. She has noted spontaneous drainage and "bleeding" into her underwear today. She denies fevers, chills, sweats, or abdominal pain. She reports chronic constipation requiring laxatives regularly. She usually stools every 2-3 days. She denies any rectal bleeding or known history of hemorrhoids. She is unaware of any known mass or swelling to the rectum, until a few days earlier.   Past Medical History:  Diagnosis Date  . Anemia   . Arthritis   . Depression   . Diverticulitis   . GERD (gastroesophageal reflux disease)   . Hypertension   . Migraines   . Stroke ([place])   . Thyroid disease     Patient Active Problem List   Diagnosis Date Noted  . Arthritis of ankle 12/28/2016  . Shoulder arthritis 12/28/2016  . H/O: stroke 12/27/2016  . Right sided weakness 12/27/2016  . Depression 12/27/2016  . GERD (gastroesophageal reflux disease) 11/07/2016  . Tobacco use disorder 11/07/2016  . Alcohol use disorder, severe, dependence ([employer]) 11/07/2016  . Severe episode of recurrent major depressive disorder, without psychotic features ([place])   . Migraines 09/05/2014  . Hypertension 01/01/2014    Past Surgical History:  Procedure Laterality Date  . NO PAST SURGERIES      Prior to Admission medications   Medication Sig Start Date End Date Taking? Authorizing Provider  acetaminophen (TYLENOL) 500 MG tablet Take 500 mg by mouth every 4 (four) hours as needed.    [provider]  amLODipine (NORVASC) 10 MG tablet Take 1 tablet (10 mg total) by mouth daily. 06/08/17   Tawni Millers, MD  aspirin EC 81 MG tablet Take 1 tablet (81 mg total) by mouth daily. 06/08/17   Tawni Millers, MD  clindamycin (CLEOCIN) 300 MG capsule Take 1 capsule (300 mg total) by mouth 3 (three) times daily for 10 days. 02/09/18 02/19/18  Julies Carmickle, Dannielle Karvonen, PA-C  dibucaine (NUPERCAINAL) 1 % OINT Place 1 application rectally as needed. 02/09/18   Caran Storck, Dannielle Karvonen, PA-C  hydrochlorothiazide (HYDRODIURIL) 25 MG tablet Take 1 tablet (25 mg total) by mouth daily. 06/08/17   Tawni Millers, MD  hydrocortisone (ANUSOL-HC) 25 MG suppository Place 1 suppository (25 mg total) rectally every 12 (twelve) hours for 12 days. 02/09/18 02/21/18  Marcellino Fidalgo, Dannielle Karvonen, PA-C  ibuprofen (ADVIL,MOTRIN) 600 MG tablet Take 1 tablet (600 mg total) by mouth every 8 (eight) hours as needed. 11/11/16   Darel Hong, MD  metoprolol tartrate (LOPRESSOR) 50 MG tablet Take 1 tablet (50 mg total) by mouth daily. 06/08/17 06/08/18  Tawni Millers, MD  nitroGLYCERIN (NITROSTAT) 0.4 MG SL tablet Place 1 tablet (0.4 mg total) under the tongue every 5 (five) minutes as needed for chest pain. 11/09/16   Pucilowska, Jolanta B, MD  polyethylene glycol (MIRALAX) packet Take 17 g by mouth 2 (two) times daily. Patient not taking: Reported on 01/04/2018 01/17/17   Gregor Hams, MD  Polyethylene Glycol 3350 (PEG 3350) POWD Take 17 g by mouth daily for 14 days. 02/09/18 02/23/18  Reizel Calzada, Dannielle Karvonen, PA-C  potassium chloride (K-DUR) 10 MEQ tablet Take 2  tablets once daily 01/04/18   Tawni Millers, MD  QUEtiapine (SEROQUEL) 200 MG tablet Take 1 tablet (200 mg total) by mouth at bedtime. 11/09/16   Pucilowska, Herma Ard B, MD  sertraline (ZOLOFT) 100 MG tablet Take 1 tablet (100 mg total) by mouth daily. 11/09/16   Pucilowska, Wardell Honour, MD  traMADol (ULTRAM) 50 MG tablet Take 1 tablet (50 mg total) by mouth 2 (two) times daily for 3 days. 02/09/18 02/12/18  Domanic Matusek, Dannielle Karvonen, PA-C  traZODone (DESYREL) 100 MG tablet Take 1 tablet  (100 mg total) by mouth at bedtime. 1/2 - 1 tablet as needed 11/09/16   Pucilowska, Wardell Honour, MD    Allergies Patient has no known allergies.  Family History  Problem Relation Age of Onset  . Cancer Mother        Uterine, Breast, Ovarian  . Hypertension Mother   . Breast cancer Mother   . Hypertension Father   . Bipolar disorder Father   . CAD Sister   . Hyperlipidemia Sister     Social History Social History   Tobacco Use  . Smoking status: Current Every Day Smoker    Packs/day: 0.25    Types: Cigarettes  . Smokeless tobacco: Never Used  Substance Use Topics  . Alcohol use: Yes    Comment: Ocassionally  . Drug use: No    Review of Systems  Constitutional: Negative for fever. Cardiovascular: Negative for chest pain. Respiratory: Negative for shortness of breath. Gastrointestinal: Negative for abdominal pain, vomiting and diarrhea. Rectal pain and swelling Genitourinary: Negative for dysuria. Musculoskeletal: Negative for back pain. Vulvar swelling Skin: Negative for rash. ____________________________________________  PHYSICAL EXAM:  VITAL SIGNS: ED Triage Vitals  Enc Vitals Group     BP 02/09/18 1149 129/72     Pulse Rate 02/09/18 1149 97     Resp 02/09/18 1149 18     Temp 02/09/18 1149 98.9 F (37.2 C)     Temp Source 02/09/18 1149 Oral     SpO2 02/09/18 1149 98 %     Weight 02/09/18 1147 180 lb (81.6 kg)     Height 02/09/18 1147 5\' 6"  (1.676 m)     Head Circumference --      Peak Flow --      Pain Score 02/09/18 1147 10     Pain Loc --      Pain Edu? --      Excl. in [place]? --    Constitutional: Alert and oriented. Well appearing and in no distress. Head: Normocephalic and atraumatic. Cardiovascular: Normal rate, regular rhythm. Normal distal pulses. Respiratory: Normal respiratory effort. No wheezes/rales/rhonchi. Gastrointestinal: Soft and nontender. No distention. Rectal exam reveals a firm, elongated, hemorrhoid arising at the 7 o'clock position,  with subcutaneous extension deep into the perineum. The hypertrophic, indurated hemorrhoid protrudes about 3 cm and hangs in the gluteal cleft. It is draining seropurulent discharge with manipulation.  GU: Normal external genitalia except for a chronic-appearing, soft, pliable, non-tender right cystic formation to the inner labia, consistent with a bartholin's gland cyst.  Musculoskeletal: Nontender with normal range of motion in all extremities.  Neurologic:  Normal gait without ataxia. Normal speech and language. No gross focal neurologic deficits are appreciated. Skin:  Skin is warm, dry and intact. No rash noted. ____________________________________________   LABS (pertinent positives/negatives)  Labs Reviewed  BASIC METABOLIC PANEL - Abnormal; Notable for the following components:      Result Value   Potassium 2.8 (*)    All  other components within normal limits  CBC WITH DIFFERENTIAL/PLATELET - Abnormal; Notable for the following components:   WBC 11.5 (*)    RDW 16.3 (*)    Neutro Abs 8.7 (*)    All other components within normal limits  ____________________________________________   RADIOLOGY  CT Pelvis with CM  IMPRESSION: Enlarged uterus containing multiple, some calcified some exophytic masses.  Suspected rectal prolapse.  Soft tissue within the region of the vulva with unknown significance. Please correlate to clinical exam. ____________________________________________  PROCEDURES  Procedures Clindamycin 300 mg PO Ultram 50 mg PO ____________________________________________  INITIAL IMPRESSION / ASSESSMENT AND PLAN / ED COURSE  Patient with ED evaluation of vulvar and rectal pain with possible abscess formation. 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 She has a rectal mass concerning for rectal prolapse versus chronic hemorrhoid.   ----------------------------------------- 4:40 PM on  02/09/2018 ----------------------------------------- Dr. Cinda Quest to the room to evaluate the patient.   ----------------------------------------- 4:59 PM on 02/09/2018 ----------------------------------------- Spoke with Dr. Rosana Hoes (surgery). He will come to the ED to evaluate the patient for appropriate disposition.  He suggests referral to colo-rectal surgery for outpatient evaluation. He would also suggest stool softener, hydrocortisone suppositories, and topical lidocaine for pain relief.   Patient with ED evaluation of rectal pain. She is found to have a stable rectal mass of unclear origin. The differential diagnosis includes rectal prolapse, versus complex (chronic) hemorrhoid or rectal mass of unclear etiology. She is referred to colo-rectal surgery in [facility] for further management. She will return to the ED as needed.  ___________________________________________  FINAL CLINICAL IMPRESSION(S) / ED DIAGNOSES  Final diagnoses:  Rectal mass      Melvenia Needles, PA-C 02/09/18 1834    Nena Polio, MD 02/09/18 2112

## 2018-02-09 NOTE — ED Notes (Signed)
Pt to ct scan via stretcher

## 2018-02-09 NOTE — Discharge Instructions (Signed)
Your exam, labs, and CT scan are concerning for a rectal prolapse or a rectal mass. You should use the suppositories as directed, and use the rectal pain jelly as needed. You will be also be discharged with antibiotics for your vulvar cyst. Take the Miralax stool softener daily. Use sitz baths (shallow warm water tub soaks) to reduce pain and swelling. You should follow-up with the [facility] Rectal

## 2018-02-20 ENCOUNTER — Ambulatory Visit: Payer: Self-pay | Admitting: Pharmacist

## 2018-02-20 ENCOUNTER — Other Ambulatory Visit: Payer: Self-pay

## 2018-02-20 ENCOUNTER — Encounter (INDEPENDENT_AMBULATORY_CARE_PROVIDER_SITE_OTHER): Payer: Self-pay

## 2018-02-20 DIAGNOSIS — Z79899 Other long term (current) drug therapy: Secondary | ICD-10-CM

## 2018-02-20 NOTE — Progress Notes (Signed)
Medication Management Clinic Visit Note  Patient: Meredith Juarez MRN: [id number] Date of Birth: [birth year] PCP: Patient, No Pcp Per   [name] 57 y.o. female presents for a Wellness visit today.  LMP  (LMP Unknown)   Patient Information   Past Medical History:  Diagnosis Date  . Anemia   . Arthritis   . Depression   . Diverticulitis   . GERD (gastroesophageal reflux disease)   . Hypertension   . Migraines   . Stroke ([place])   . Thyroid disease       Past Surgical History:  Procedure Laterality Date  . NO PAST SURGERIES       Family History  Problem Relation Age of Onset  . Cancer Mother        Uterine, Breast, Ovarian  . Hypertension Mother   . Breast cancer Mother   . Hypertension Father   . Bipolar disorder Father   . CAD Sister   . Hyperlipidemia Sister     New Diagnoses (since last visit):   Family Support: siblings  Lifestyle Diet: Breakfast:none Lunch: chicken, tossed salad Dinner:same as above Drinks:water/ginger ale/iced tea    Current Exercise Habits: The patient does not participate in regular exercise at present       Social History   Substance and Sexual Activity  Alcohol Use Not Currently      Social History   Tobacco Use  Smoking Status Former Smoker  . Packs/day: 0.25  . Types: Cigarettes  Smokeless Tobacco Never Used      Health Maintenance  Topic Date Due  . Hepatitis C Screening  April 17, 1961  . TETANUS/TDAP  03/02/1980  . COLONOSCOPY  03/03/2011  . INFLUENZA VACCINE  01/26/2018  . MAMMOGRAM  10/20/2019  . PAP SMEAR  10/19/2020  . HIV Screening  Completed   Health Maintenance/Date Completed  Last ED visit: [id number] Last Visit to PCP: [id number] Next Visit to PCP: [id number] Ob-gyn visit [phone] (no pap needed)  Outpatient Encounter Medications as of 02/20/2018  Medication Sig  . acetaminophen (TYLENOL) 500 MG tablet Take 500 mg by mouth every 4 (four) hours as needed.  Marland Kitchen amLODipine (NORVASC) 10 MG tablet Take 1  tablet (10 mg total) by mouth daily.  Marland Kitchen aspirin EC 81 MG tablet Take 1 tablet (81 mg total) by mouth daily.  . clindamycin (CLEOCIN) 300 MG capsule Take 300 mg by mouth 3 (three) times daily.  . dibucaine (NUPERCAINAL) 1 % OINT Place 1 application rectally as needed.  . hydrochlorothiazide (HYDRODIURIL) 25 MG tablet Take 1 tablet (25 mg total) by mouth daily.  . hydrocortisone (ANUSOL-HC) 25 MG suppository Place 1 suppository (25 mg total) rectally every 12 (twelve) hours for 12 days.  Marland Kitchen ibuprofen (ADVIL,MOTRIN) 600 MG tablet Take 1 tablet (600 mg total) by mouth every 8 (eight) hours as needed.  . metoprolol tartrate (LOPRESSOR) 50 MG tablet Take 1 tablet (50 mg total) by mouth daily.  . nitroGLYCERIN (NITROSTAT) 0.4 MG SL tablet Place 1 tablet (0.4 mg total) under the tongue every 5 (five) minutes as needed for chest pain.  . polyethylene glycol (MIRALAX) packet Take 17 g by mouth 2 (two) times daily.  . potassium chloride (K-DUR) 10 MEQ tablet Take 2 tablets once daily  . QUEtiapine (SEROQUEL) 200 MG tablet Take 1 tablet (200 mg total) by mouth at bedtime.  . sertraline (ZOLOFT) 100 MG tablet Take 1 tablet (100 mg total) by mouth daily.  . traZODone (DESYREL) 100 MG tablet Take 1 tablet (100 mg  total) by mouth at bedtime. 1/2 - 1 tablet as needed  . Polyethylene Glycol 3350 (PEG 3350) POWD Take 17 g by mouth daily for 14 days. (Patient not taking: Reported on 02/20/2018)   No facility-administered encounter medications on file as of 02/20/2018.      Assessment and Plan: Pain: Patient has osteoarthritis, takes Ibuprofen/Tylenol as needed, but states they are not relieving her pain. Will contact [facility] for visit sooner than her scheduled October visit.  GERD: quite often S/S. Worse at night. Stated she took antacid/PPI prior but no longer has a prescription for. Will contact [facility] for visit sooner than her scheduled October visit.  Hypertension: Patient takes Amlodipine  10 mg daily, Metoprolol 50 mg daily, HCTZ 25 mg daily, and KCl 20 mEq daily to replenish her chronic low potassium due to HCTZ use.  Depression: Trazodone as needed, Seroquel 100 mg at bedtime, Zoloft 100 mg daily.  Anemia: no issues at this visit.  Diverticulitis: currently completing Clindamycin 300 mg TID for which she was prescribed after recent ED visit.  Stroke: secondary prevention with ASA 81 mg daily.  Thyroid Disease: most recent TSH in 12/2017 was WNL.  Scheduled patient follow-up within 1 year.  Paticia Stack, PharmD Pharmacy Resident  02/20/2018 3:24 PM

## 2018-02-23 DIAGNOSIS — K623 Rectal prolapse: Secondary | ICD-10-CM | POA: Insufficient documentation

## 2018-03-03 ENCOUNTER — Other Ambulatory Visit: Payer: Self-pay | Admitting: Internal Medicine

## 2018-03-19 IMAGING — DX DG ABDOMEN 1V
2 series · 2 of 2 positions shown · non-contrast
Comparison: CT 10/31/2013, radiograph 11/06/2007

CLINICAL DATA: Generalized abdominal pain for 6 days

EXAM:
ABDOMEN - 1 VIEW

[abdomen kub (1 of 2)]
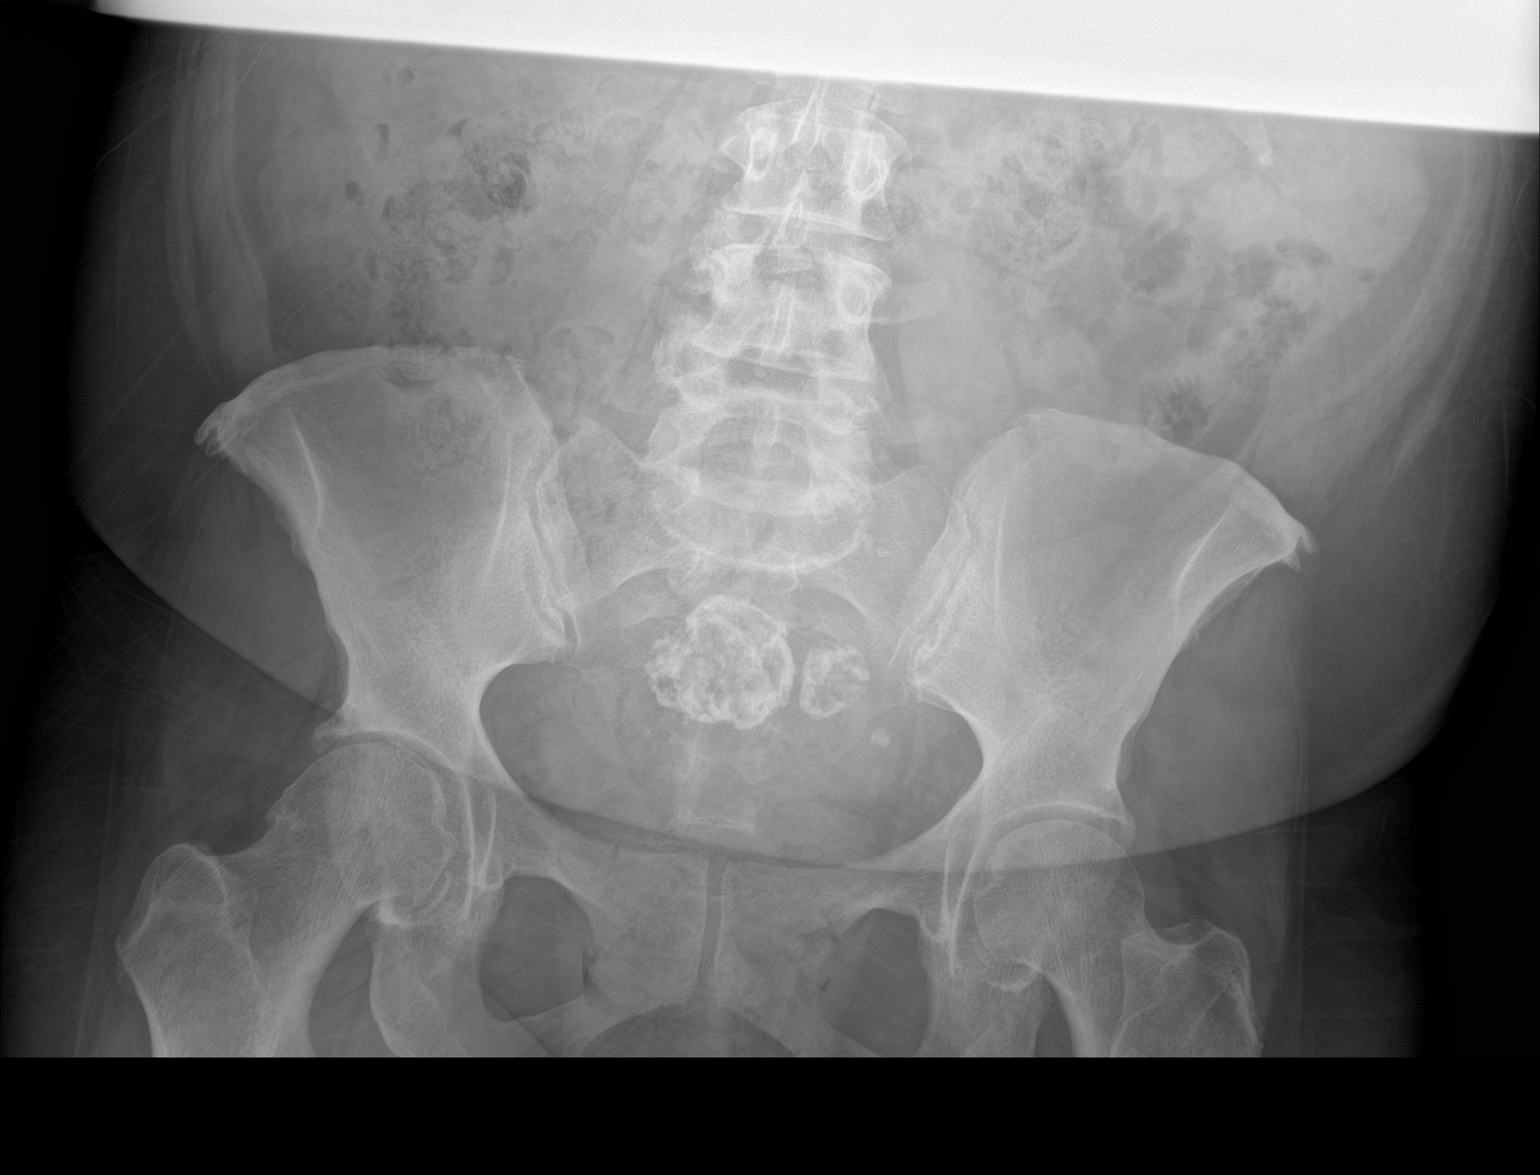

[abdomen kub (2 of 2)]
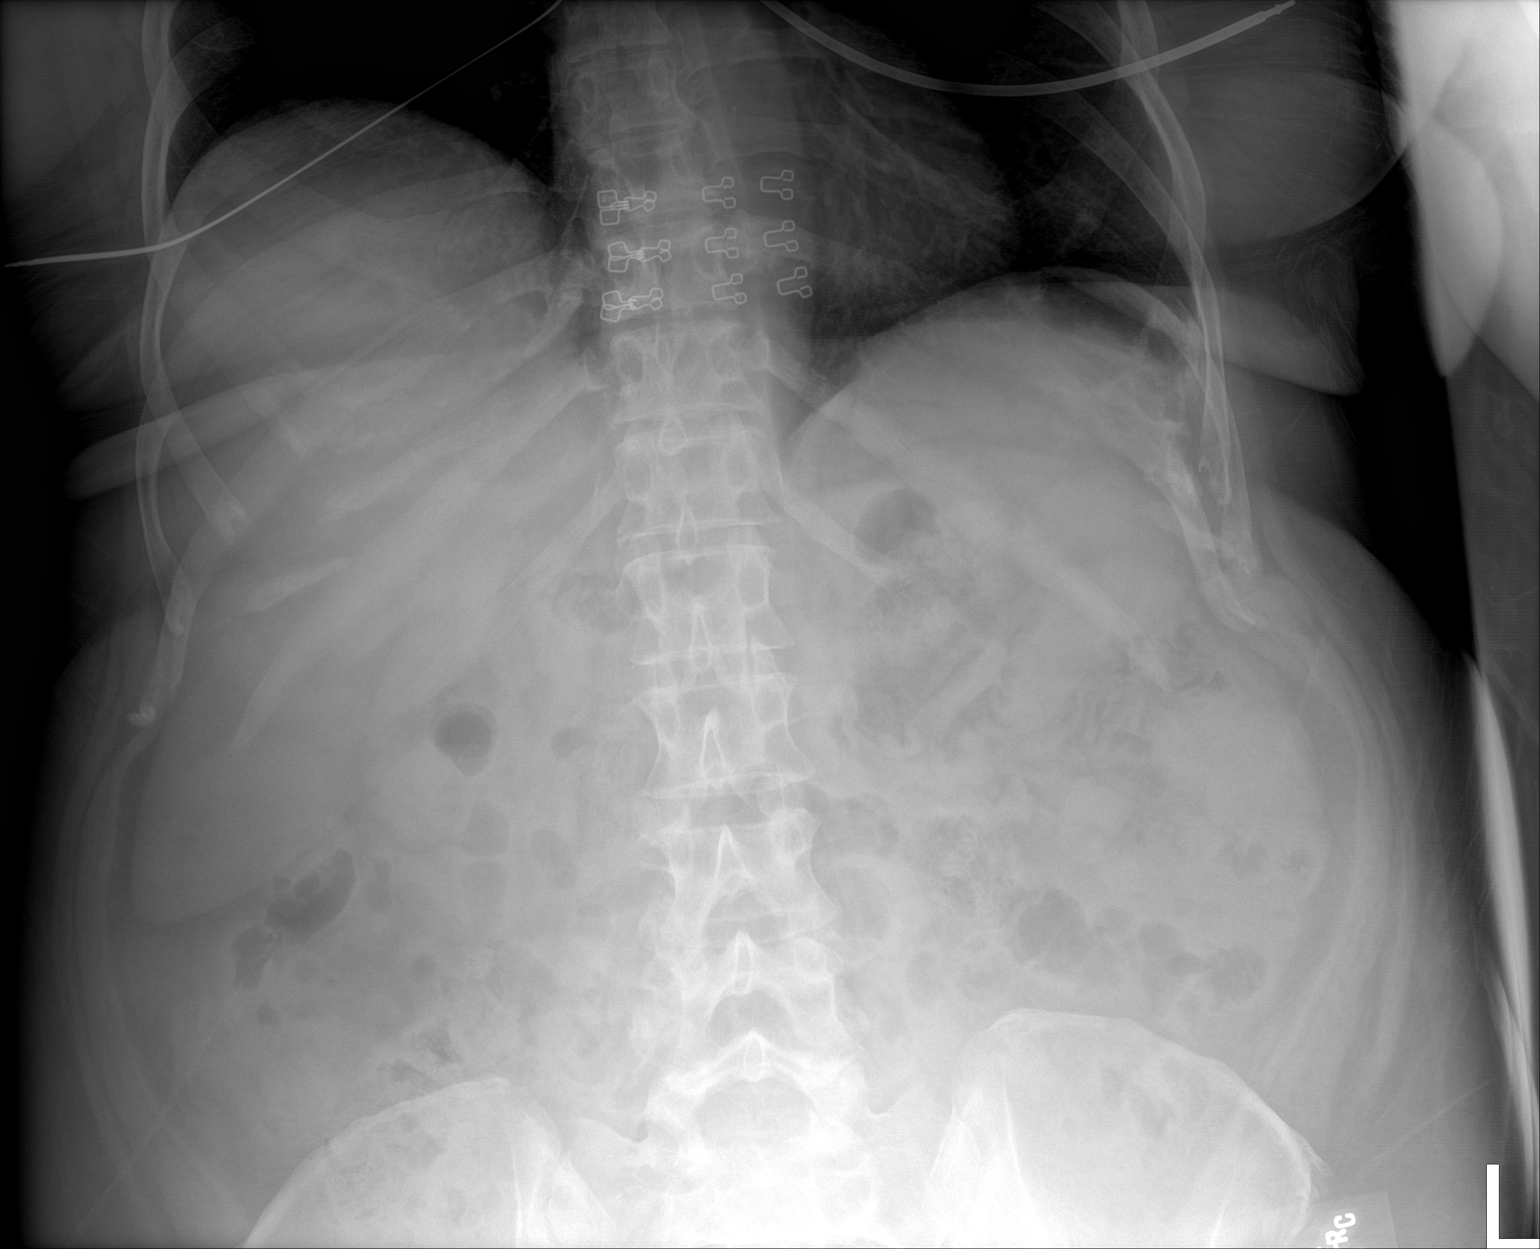

[2 of 2 positions shown; findings below may reference images not displayed]

FINDINGS: Visualized lung bases are clear. Nonobstructed bowel-gas pattern.
Coarse calcifications within the pelvis as before suggesting
calcified uterine fibroids. Possible left pelvic phleboliths.
IMPRESSION: Nonobstructed gas pattern. Calcified uterine fibroids. Coarse
calcification left pelvis measuring 5 mm, could relate to
phlebolith, if symptoms are suggestive of a stone, CT KUB could be
obtained.

## 2018-03-29 ENCOUNTER — Other Ambulatory Visit: Payer: Medicaid Other

## 2018-03-29 DIAGNOSIS — I10 Essential (primary) hypertension: Secondary | ICD-10-CM

## 2018-03-30 LAB — URINALYSIS
Bilirubin, UA: NEGATIVE
Glucose, UA: NEGATIVE
Ketones, UA: NEGATIVE
LEUKOCYTES UA: NEGATIVE
NITRITE UA: NEGATIVE
PH UA: 5 (ref 5.0–7.5)
Protein, UA: NEGATIVE
RBC, UA: NEGATIVE
Specific Gravity, UA: 1.008 (ref 1.005–1.030)
Urobilinogen, Ur: 0.2 mg/dL (ref 0.2–1.0)

## 2018-03-30 LAB — CBC
HEMATOCRIT: 41 % (ref 34.0–46.6)
HEMOGLOBIN: 13.6 g/dL (ref 11.1–15.9)
MCH: 26.5 pg — AB (ref 26.6–33.0)
MCHC: 33.2 g/dL (ref 31.5–35.7)
MCV: 80 fL (ref 79–97)
Platelets: 329 10*3/uL (ref 150–450)
RBC: 5.14 x10E6/uL (ref 3.77–5.28)
RDW: 15.2 % (ref 12.3–15.4)
WBC: 6.4 10*3/uL (ref 3.4–10.8)

## 2018-03-30 LAB — COMPREHENSIVE METABOLIC PANEL
A/G RATIO: 1.4 (ref 1.2–2.2)
ALBUMIN: 3.9 g/dL (ref 3.5–5.5)
ALK PHOS: 67 IU/L (ref 39–117)
ALT: 9 IU/L (ref 0–32)
AST: 14 IU/L (ref 0–40)
BUN/Creatinine Ratio: 12 (ref 9–23)
BUN: 8 mg/dL (ref 6–24)
Bilirubin Total: 0.6 mg/dL (ref 0.0–1.2)
CHLORIDE: 103 mmol/L (ref 96–106)
CO2: 16 mmol/L — AB (ref 20–29)
CREATININE: 0.68 mg/dL (ref 0.57–1.00)
Calcium: 9.4 mg/dL (ref 8.7–10.2)
GFR calc non Af Amer: 97 mL/min/{1.73_m2} (ref >59)
GFR, EST AFRICAN AMERICAN: 112 mL/min/{1.73_m2} (ref >59)
Globulin, Total: 2.7 g/dL (ref 1.5–4.5)
Glucose: 110 mg/dL — ABNORMAL HIGH (ref 65–99)
POTASSIUM: 3.6 mmol/L (ref 3.5–5.2)
Sodium: 139 mmol/L (ref 134–144)
Total Protein: 6.6 g/dL (ref 6.0–8.5)

## 2018-04-05 ENCOUNTER — Ambulatory Visit: Payer: Medicaid Other | Admitting: Internal Medicine

## 2018-04-05 VITALS — BP 131/93 | HR 66 | Temp 98.1°F | Ht 66.0 in | Wt 192.1 lb

## 2018-04-05 DIAGNOSIS — K5900 Constipation, unspecified: Secondary | ICD-10-CM

## 2018-04-05 DIAGNOSIS — R079 Chest pain, unspecified: Secondary | ICD-10-CM

## 2018-04-05 DIAGNOSIS — D259 Leiomyoma of uterus, unspecified: Secondary | ICD-10-CM | POA: Insufficient documentation

## 2018-04-05 DIAGNOSIS — K623 Rectal prolapse: Secondary | ICD-10-CM

## 2018-04-05 DIAGNOSIS — I10 Essential (primary) hypertension: Secondary | ICD-10-CM

## 2018-04-05 DIAGNOSIS — M19019 Primary osteoarthritis, unspecified shoulder: Secondary | ICD-10-CM

## 2018-04-05 MED ORDER — METOPROLOL TARTRATE 50 MG PO TABS: 50.0000 mg | ORAL_TABLET | Freq: Every day | ORAL | 3 refills | Status: DC

## 2018-04-05 MED ORDER — AMLODIPINE BESYLATE 10 MG PO TABS: 10.0000 mg | ORAL_TABLET | Freq: Every day | ORAL | 3 refills | Status: DC

## 2018-04-05 MED ORDER — NITROGLYCERIN 0.4 MG SL SUBL: 0.4000 mg | SUBLINGUAL_TABLET | SUBLINGUAL | 4 refills | Status: AC | PRN

## 2018-04-05 MED ORDER — HYDROCHLOROTHIAZIDE 25 MG PO TABS: 25.0000 mg | ORAL_TABLET | Freq: Every day | ORAL | 3 refills | Status: DC

## 2018-04-05 MED ORDER — POTASSIUM CHLORIDE ER 10 MEQ PO TBCR: EXTENDED_RELEASE_TABLET | ORAL | 3 refills | Status: AC

## 2018-04-05 MED ORDER — POLYETHYLENE GLYCOL 3350 17 G PO PACK: 17.0000 g | PACK | Freq: Two times a day (BID) | ORAL | 3 refills | Status: AC

## 2018-04-05 MED ORDER — IBUPROFEN 600 MG PO TABS: 600.0000 mg | ORAL_TABLET | Freq: Three times a day (TID) | ORAL | 3 refills | Status: AC | PRN

## 2018-04-05 NOTE — Progress Notes (Signed)
Subjective:    Patient ID: [name], female    DOB: 07/15/1960, 57 y.o.   MRN: [id number]  HPI  Pt recently went to the ED on 02/09/18 with diagnosis of a Rectal Mass. Pt has not followed up with a specialist.   Pt needs BP medication refill. She took her last tablet today.   Pt reports using a laxative in order to have bowel movements.   Pt has a hx of fibroids in the uterus.   Chief Complaint  Patient presents with  . Hypertension    needs bp med refilled    Review of Systems Patient Active Problem List   Diagnosis Date Noted  . Fibroid uterus 04/05/2018  . Rectal prolapse   . Arthritis of ankle 12/28/2016  . Shoulder arthritis 12/28/2016  . H/O: stroke 12/27/2016  . Right sided weakness 12/27/2016  . Depression 12/27/2016  . GERD (gastroesophageal reflux disease) 11/07/2016  . Tobacco use disorder 11/07/2016  . Alcohol use disorder, severe, dependence ([place]) 11/07/2016  . Severe episode of recurrent major depressive disorder, without psychotic features ([place])   . Migraines 09/05/2014  . Hypertension 01/01/2014   Allergies as of 04/05/2018   No Known Allergies     Medication List        Accurate as of 04/05/18 11:06 AM. Always use your most recent med list.          acetaminophen 500 MG tablet Commonly known as:  TYLENOL Take 500 mg by mouth every 4 (four) hours as needed.   amLODipine 10 MG tablet Commonly known as:  NORVASC TAKE ONE TABLET BY MOUTH EVERY DAY   aspirin EC 81 MG tablet TAKE ONE TABLET BY MOUTH EVERY DAY   dibucaine 1 % Oint Commonly known as:  NUPERCAINAL Place 1 application rectally as needed.   hydrochlorothiazide 25 MG tablet Commonly known as:  HYDRODIURIL TAKE ONE TABLET BY MOUTH EVERY DAY   ibuprofen 600 MG tablet Commonly known as:  ADVIL,MOTRIN Take 1 tablet (600 mg total) by mouth every 8 (eight) hours as needed.   metoprolol tartrate 50 MG tablet Commonly known as:  LOPRESSOR Take 1 tablet (50 mg total) by mouth  daily.   nitroGLYCERIN 0.4 MG SL tablet Commonly known as:  NITROSTAT Place 1 tablet (0.4 mg total) under the tongue every 5 (five) minutes as needed for chest pain.   polyethylene glycol packet Commonly known as:  MIRALAX / GLYCOLAX Take 17 g by mouth 2 (two) times daily.   potassium chloride 10 MEQ tablet Commonly known as:  K-DUR Take 2 tablets once daily   QUEtiapine 200 MG tablet Commonly known as:  SEROQUEL Take 1 tablet (200 mg total) by mouth at bedtime.   sertraline 100 MG tablet Commonly known as:  ZOLOFT Take 1 tablet (100 mg total) by mouth daily.   traZODone 100 MG tablet Commonly known as:  DESYREL Take 1 tablet (100 mg total) by mouth at bedtime. 1/2 - 1 tablet as needed          Objective:   Physical Exam  Constitutional: She is oriented to person, place, and time.  Cardiovascular: Normal rate, regular rhythm and normal heart sounds.  Pulmonary/Chest: Effort normal and breath sounds normal.  Neurological: She is alert and oriented to person, place, and time.    BP (!) 131/93 (BP Location: Left Arm, Patient Position: Sitting)   Pulse 66   Temp 98.1 F (36.7 C)   Ht _0  (1.676 m)   Wt  192 lb 1.6 oz (87.1 kg)   LMP  (LMP Unknown)   BMI 31.01 kg/m      Assessment & Plan:   Hypertension: Pt received refills for Norvasc, HCTZ, Lopressor, and Potassium Chloride.   Rectal Prolapse/Mass?: Work on referral and [facility]. Staff is working on creating the appropriate referral.   Shoulder Arthritis: Refilled Ibuprofen.    Constipation: Refilled Miralax.   Uterine leiomyoma/fibroids: Additional workup may be needed.   Return in 3 months with labs (Met C, CBC, UA) a week prior.

## 2018-04-20 ENCOUNTER — Telehealth: Payer: Self-pay

## 2018-04-20 NOTE — Telephone Encounter (Signed)
Sent referral for Rectal prolapse/mass to [facility] with notes and copy of patients [facility] approved Financial Assistance letter.  Waiting for response.

## 2018-07-05 ENCOUNTER — Other Ambulatory Visit: Payer: Medicaid Other

## 2018-07-05 DIAGNOSIS — I10 Essential (primary) hypertension: Secondary | ICD-10-CM

## 2018-07-06 LAB — URINALYSIS
Bilirubin, UA: NEGATIVE
Glucose, UA: NEGATIVE
Ketones, UA: NEGATIVE
LEUKOCYTES UA: NEGATIVE
NITRITE UA: NEGATIVE
PH UA: 6 (ref 5.0–7.5)
Protein, UA: NEGATIVE
RBC, UA: NEGATIVE
Specific Gravity, UA: 1.011 (ref 1.005–1.030)
Urobilinogen, Ur: 0.2 mg/dL (ref 0.2–1.0)

## 2018-07-06 LAB — COMPREHENSIVE METABOLIC PANEL
ALBUMIN: 4.7 g/dL (ref 3.5–5.5)
ALK PHOS: 76 IU/L (ref 39–117)
ALT: 10 IU/L (ref 0–32)
AST: 11 IU/L (ref 0–40)
Albumin/Globulin Ratio: 1.8 (ref 1.2–2.2)
BILIRUBIN TOTAL: 1 mg/dL (ref 0.0–1.2)
BUN / CREAT RATIO: 9 (ref 9–23)
BUN: 6 mg/dL (ref 6–24)
CO2: 19 mmol/L — AB (ref 20–29)
CREATININE: 0.67 mg/dL (ref 0.57–1.00)
Calcium: 9.8 mg/dL (ref 8.7–10.2)
Chloride: 104 mmol/L (ref 96–106)
GFR calc Af Amer: 113 mL/min/{1.73_m2} (ref >59)
GFR calc non Af Amer: 98 mL/min/{1.73_m2} (ref >59)
Globulin, Total: 2.6 g/dL (ref 1.5–4.5)
Glucose: 92 mg/dL (ref 65–99)
Potassium: 3.7 mmol/L (ref 3.5–5.2)
Sodium: 140 mmol/L (ref 134–144)
Total Protein: 7.3 g/dL (ref 6.0–8.5)

## 2018-07-06 LAB — CBC
HEMATOCRIT: 42.5 % (ref 34.0–46.6)
HEMOGLOBIN: 14.3 g/dL (ref 11.1–15.9)
MCH: 27.1 pg (ref 26.6–33.0)
MCHC: 33.6 g/dL (ref 31.5–35.7)
MCV: 81 fL (ref 79–97)
Platelets: 330 10*3/uL (ref 150–450)
RBC: 5.27 x10E6/uL (ref 3.77–5.28)
RDW: 13.6 % (ref 11.7–15.4)
WBC: 6.6 10*3/uL (ref 3.4–10.8)

## 2018-07-12 ENCOUNTER — Encounter: Payer: Self-pay | Admitting: Internal Medicine

## 2018-07-12 ENCOUNTER — Ambulatory Visit: Payer: Medicaid Other | Admitting: Internal Medicine

## 2018-07-12 VITALS — BP 175/102 | HR 95 | Temp 98.3°F | Ht 65.0 in | Wt 195.2 lb

## 2018-07-12 DIAGNOSIS — I10 Essential (primary) hypertension: Secondary | ICD-10-CM

## 2018-07-12 DIAGNOSIS — R070 Pain in throat: Secondary | ICD-10-CM

## 2018-07-12 MED ORDER — CETIRIZINE HCL 10 MG PO TABS: 10.0000 mg | ORAL_TABLET | Freq: Every day | ORAL | 0 refills | Status: DC

## 2018-07-12 NOTE — Progress Notes (Signed)
Subjective:    Patient ID: Meredith Juarez, female    DOB: 1960-10-01, 58 y.o.   MRN: [id number]  HPI Pt presents for 3 month follow up appointment to check BP.   Pt also reports not feeling well this past week. Symptoms include sore throat, coughing up phlegm, congestion, ear pain.   Chief Complaint  Patient presents with  . Follow-up    3 MO FU    Review of Systems Patient Active Problem List   Diagnosis Date Noted  . Fibroid uterus 04/05/2018  . Rectal prolapse   . Arthritis of ankle 12/28/2016  . Shoulder arthritis 12/28/2016  . H/O: stroke 12/27/2016  . Right sided weakness 12/27/2016  . Depression 12/27/2016  . GERD (gastroesophageal reflux disease) 11/07/2016  . Tobacco use disorder 11/07/2016  . Alcohol use disorder, severe, dependence ([place]) 11/07/2016  . Severe episode of recurrent major depressive disorder, without psychotic features ([place])   . Migraines 09/05/2014  . Hypertension 01/01/2014   Allergies as of 07/12/2018   No Known Allergies     Medication List       Accurate as of July 12, 2018 10:35 AM. Always use your most recent med list.        acetaminophen 500 MG tablet Commonly known as:  TYLENOL Take 500 mg by mouth every 4 (four) hours as needed.   amLODipine 10 MG tablet Commonly known as:  NORVASC Take 1 tablet (10 mg total) by mouth daily.   aspirin EC 81 MG tablet TAKE ONE TABLET BY MOUTH EVERY DAY   dibucaine 1 % Oint Commonly known as:  NUPERCAINAL Place 1 application rectally as needed.   hydrochlorothiazide 25 MG tablet Commonly known as:  HYDRODIURIL Take 1 tablet (25 mg total) by mouth daily.   ibuprofen 600 MG tablet Commonly known as:  ADVIL,MOTRIN Take 1 tablet (600 mg total) by mouth every 8 (eight) hours as needed.   metoprolol tartrate 50 MG tablet Commonly known as:  LOPRESSOR Take 1 tablet (50 mg total) by mouth daily.   nitroGLYCERIN 0.4 MG SL tablet Commonly known as:  NITROSTAT Place 1 tablet (0.4 mg  total) under the tongue every 5 (five) minutes as needed for chest pain.   polyethylene glycol packet Commonly known as:  MIRALAX Take 17 g by mouth 2 (two) times daily.   potassium chloride 10 MEQ tablet Commonly known as:  K-DUR Take 2 tablets once daily   QUEtiapine 200 MG tablet Commonly known as:  SEROQUEL Take 1 tablet (200 mg total) by mouth at bedtime.   sertraline 100 MG tablet Commonly known as:  ZOLOFT Take 1 tablet (100 mg total) by mouth daily.   traZODone 100 MG tablet Commonly known as:  DESYREL Take 1 tablet (100 mg total) by mouth at bedtime. 1/2 - 1 tablet as needed         Objective:   Physical Exam HENT:     Right Ear: Tympanic membrane and ear canal normal. No swelling.     Left Ear: Tympanic membrane and ear canal normal. No swelling.  Cardiovascular:     Rate and Rhythm: Normal rate and regular rhythm.  Pulmonary:     Effort: Pulmonary effort is normal. No respiratory distress.     Breath sounds: Normal breath sounds. No wheezing or rales.  Neurological:     Mental Status: She is alert and oriented to person, place, and time.    BP (!) 175/102   Pulse 95   Temp 98.3 F (  36.8 C) (Oral)   Ht 5\' 5"  (1.651 m)   Wt 195 lb 3.2 oz (88.5 kg)   LMP  (LMP Unknown)   BMI 32.48 kg/m    BP rechecked in exam room @ 10:42 AM 160/92  Lymph nodes do not appear to be enlarged.  Knot in back over right scapula area  appears to be fatty deposit.      Assessment & Plan:   1. Hypertension, unspecified type Uncontrolled. Pt has not been regularly taking BP medication due to throat discomfort. Pt advised to take tablets with apple sauce to help with discomfort. Will recheck BP in 2 weeks.   2. Throat pain Pt advised to gargle with warm salt water. Throat discomfort, ear discomfort, and face rash all appear to be related to seasonal allergies. Recheck in a few weeks or if symptom worsen.   Order Today:  - cetirizine (ZYRTEC) 10 MG tablet; Take 1 tablet  (10 mg total) by mouth daily.  Dispense: 30 tablet; Refill: 0   Return in 1-2 weeks for recheck.

## 2018-07-26 ENCOUNTER — Ambulatory Visit: Payer: Self-pay | Admitting: Internal Medicine

## 2018-08-09 ENCOUNTER — Ambulatory Visit: Payer: Self-pay | Admitting: Internal Medicine

## 2018-08-16 ENCOUNTER — Ambulatory Visit: Payer: Medicaid Other | Admitting: Internal Medicine

## 2018-08-16 ENCOUNTER — Encounter: Payer: Self-pay | Admitting: Internal Medicine

## 2018-08-16 VITALS — BP 163/104 | HR 83 | Temp 95.3°F | Ht 66.0 in | Wt 191.4 lb

## 2018-08-16 DIAGNOSIS — D259 Leiomyoma of uterus, unspecified: Secondary | ICD-10-CM

## 2018-08-16 DIAGNOSIS — I10 Essential (primary) hypertension: Secondary | ICD-10-CM

## 2018-08-16 DIAGNOSIS — K623 Rectal prolapse: Secondary | ICD-10-CM

## 2018-08-16 MED ORDER — LOSARTAN POTASSIUM 50 MG PO TABS: 50.0000 mg | ORAL_TABLET | Freq: Every day | ORAL | 1 refills | Status: DC

## 2018-08-16 NOTE — Progress Notes (Signed)
Subjective:    Patient ID: Meredith Juarez, female    DOB: 01-Aug-1960, 58 y.o.   MRN: [id number]  HPI  Patient is a 58 year old female who presents for follow up. Pt reports that she is having monthly vaginal bleeding for 3-4 days at a time. Pt has hx of fibroid uterus.  Needs referral for rectal prolapse/mass to [facility] to be completed. Pt unaware of [place] status.   Chief Complaint  Patient presents with  . Follow-up    Has been feeling really bad last few days and is having trouble sleeping     Review of Systems   Patient Active Problem List   Diagnosis Date Noted  . Fibroid uterus 04/05/2018  . Rectal prolapse   . Arthritis of ankle 12/28/2016  . Shoulder arthritis 12/28/2016  . H/O: stroke 12/27/2016  . Right sided weakness 12/27/2016  . Depression 12/27/2016  . GERD (gastroesophageal reflux disease) 11/07/2016  . Tobacco use disorder 11/07/2016  . Alcohol use disorder, severe, dependence ([place]) 11/07/2016  . Severe episode of recurrent major depressive disorder, without psychotic features ([place])   . Migraines 09/05/2014  . Hypertension 01/01/2014   Allergies as of 08/16/2018   No Known Allergies     Medication List       Accurate as of August 16, 2018 10:36 AM. Always use your most recent med list.        acetaminophen 500 MG tablet Commonly known as:  TYLENOL Take 500 mg by mouth every 4 (four) hours as needed.   amLODipine 10 MG tablet Commonly known as:  NORVASC Take 1 tablet (10 mg total) by mouth daily.   aspirin EC 81 MG tablet TAKE ONE TABLET BY MOUTH EVERY DAY   cetirizine 10 MG tablet Commonly known as:  ZYRTEC Take 1 tablet (10 mg total) by mouth daily.   hydrochlorothiazide 25 MG tablet Commonly known as:  HYDRODIURIL Take 1 tablet (25 mg total) by mouth daily.   ibuprofen 600 MG tablet Commonly known as:  ADVIL,MOTRIN Take 1 tablet (600 mg total) by mouth every 8 (eight) hours as needed.   metoprolol tartrate 50  MG tablet Commonly known as:  LOPRESSOR Take 1 tablet (50 mg total) by mouth daily.   nitroGLYCERIN 0.4 MG SL tablet Commonly known as:  NITROSTAT Place 1 tablet (0.4 mg total) under the tongue every 5 (five) minutes as needed for chest pain.   polyethylene glycol packet Commonly known as:  MIRALAX Take 17 g by mouth 2 (two) times daily.   potassium chloride 10 MEQ tablet Commonly known as:  K-DUR Take 2 tablets once daily   QUEtiapine 200 MG tablet Commonly known as:  SEROQUEL Take 1 tablet (200 mg total) by mouth at bedtime.   sertraline 100 MG tablet Commonly known as:  ZOLOFT Take 1 tablet (100 mg total) by mouth daily.   traZODone 100 MG tablet Commonly known as:  DESYREL Take 1 tablet (100 mg total) by mouth at bedtime. 1/2 - 1 tablet as needed         Objective:   Physical Exam  BP (!) 163/104   Pulse 83   Temp (!) 95.3 F (35.2 C) (Oral)   Ht 5\' 6"  (1.676 m)   Wt 191 lb 6.4 oz (86.8 kg)   LMP  (LMP Unknown) Comment: Pt reports that she is bleeding every month   BMI 30.89 kg/m      Assessment & Plan:   1. Hypertension, unspecified type  Remains uncontrolled. Start Losartan 50 mg daily.   Order Today:  - losartan (COZAAR) 50 MG tablet; Take 1 tablet (50 mg total) by mouth daily.  Dispense: 30 tablet; Refill: 1  2. Rectal prolapse Patient has referral for [facility] and pt has active [facility]. Lurena Nida will check on status of referral.   3. Uterine leiomyoma, unspecified location Patient reports monthly vaginal bleeding for 3-4 days for the past few months.   STOP daily Aspirin which originally was prescribed for TIA years back.        RTE in 3-4 weeks, no labs

## 2018-08-24 ENCOUNTER — Telehealth: Payer: Self-pay

## 2018-08-24 NOTE — Telephone Encounter (Signed)
I left very detailed message for patient concerning an appt that I scheduled for patient with General Surgery on 3/2 at 1:30pm with Dr Celine Ahr at [facility] at [address]. I asked her to call me back so that I know that she is able to make this appt.

## 2018-08-25 ENCOUNTER — Telehealth: Payer: Self-pay

## 2018-08-25 NOTE — Telephone Encounter (Signed)
Chanita called to let me know that she received my message about appt with [facility], Dr Celine Ahr. She changed appt day to 09/11/2018. Her [facility] will expire on 09/27/2018. We will start new application for patient so this does not lapse in coverage dates.  Mailed patient a copy of [facility].

## 2018-08-28 ENCOUNTER — Ambulatory Visit: Payer: Self-pay | Admitting: General Surgery

## 2018-09-06 ENCOUNTER — Ambulatory Visit: Payer: Self-pay | Admitting: Internal Medicine

## 2018-09-11 ENCOUNTER — Other Ambulatory Visit: Payer: Self-pay | Admitting: Internal Medicine

## 2018-09-11 ENCOUNTER — Encounter: Payer: Self-pay | Admitting: General Surgery

## 2018-09-11 ENCOUNTER — Ambulatory Visit (INDEPENDENT_AMBULATORY_CARE_PROVIDER_SITE_OTHER): Payer: Self-pay | Admitting: General Surgery

## 2018-09-11 ENCOUNTER — Other Ambulatory Visit: Payer: Self-pay

## 2018-09-11 VITALS — BP 132/89 | HR 76 | Temp 97.7°F | Ht 66.0 in | Wt 191.4 lb

## 2018-09-11 DIAGNOSIS — R070 Pain in throat: Secondary | ICD-10-CM

## 2018-09-11 DIAGNOSIS — K644 Residual hemorrhoidal skin tags: Secondary | ICD-10-CM | POA: Insufficient documentation

## 2018-09-11 NOTE — Progress Notes (Signed)
Patient ID: Meredith Juarez, female   DOB: [birth year], 58 y.o.   MRN: [id number]  Chief Complaint  Patient presents with  . New Patient (Initial Visit)     new pt ref Dr.Chaplin eval rectal mass    HPI Meredith Juarez is a 58 y.o. female.   She was seen in the emergency department in August 2019 and diagnosed with possible rectal prolapse.  She was seen at that time by Dr. Tama High, one of my partners.  He felt that she should be referred to a colon and rectal surgeon for further management.  Unfortunately, for what ever reason, she never was seen by a specialist.  She has been referred today, by her primary care provider, for evaluation of a rectal mass.  Today, Ms. Bogle states that "I do not feel like myself" and reports that this is been going on for many years.  She reports having lots of health problems.  She endorses depression.  She knows that she was sent to see me for a rectal mass.  She says that it has been present for years.  She says that sometimes it aches badly and that she experiences bleeding "every time I use the bathroom".  She takes ibuprofen and Tylenol for her pain with some relief.  She does endorse constipation but says that the medications (MiraLAX) she has been taking do nothing to help.  She is also concerned about a mass on her vagina.  Based upon my reading of the emergency department report, this sounds like an infected Bartholin's gland.  I told her that I would not be able to help her with this issue today, and that she should seek the care of a gynecologist.   Past Medical History:  Diagnosis Date  . Anemia   . Arthritis   . Depression   . Diverticulitis   . GERD (gastroesophageal reflux disease)   . Hypertension   . Migraines   . Stroke ([place])   . Thyroid disease     Past Surgical History:  Procedure Laterality Date  . NO PAST SURGERIES      Family History  Problem Relation Age of Onset  . Cancer Mother        Uterine, Breast, Ovarian  .  Hypertension Mother   . Breast cancer Mother   . Hypertension Father   . Bipolar disorder Father   . CAD Sister   . Hyperlipidemia Sister     Social History Social History   Tobacco Use  . Smoking status: Former Smoker    Packs/day: 0.25    Types: Cigarettes  . Smokeless tobacco: Never Used  Substance Use Topics  . Alcohol use: Not Currently  . Drug use: No    No Known Allergies  Current Outpatient Medications  Medication Sig Dispense Refill  . acetaminophen (TYLENOL) 500 MG tablet Take 500 mg by mouth every 4 (four) hours as needed.    Marland Kitchen amLODipine (NORVASC) 10 MG tablet Take 1 tablet (10 mg total) by mouth daily. 90 tablet 3  . aspirin EC 81 MG tablet TAKE ONE TABLET BY MOUTH EVERY DAY 90 tablet 0  . cetirizine (ZYRTEC) 10 MG tablet Take 1 tablet (10 mg total) by mouth daily. 30 tablet 0  . hydrochlorothiazide (HYDRODIURIL) 25 MG tablet Take 1 tablet (25 mg total) by mouth daily. 90 tablet 3  . ibuprofen (ADVIL,MOTRIN) 600 MG tablet Take 1 tablet (600 mg total) by mouth every 8 (eight) hours as needed. 90 tablet 3  .  losartan (COZAAR) 50 MG tablet Take 1 tablet (50 mg total) by mouth daily. 30 tablet 1  . metoprolol tartrate (LOPRESSOR) 50 MG tablet Take 1 tablet (50 mg total) by mouth daily. 90 tablet 3  . nitroGLYCERIN (NITROSTAT) 0.4 MG SL tablet Place 1 tablet (0.4 mg total) under the tongue every 5 (five) minutes as needed for chest pain. 25 tablet 4  . polyethylene glycol (MIRALAX) packet Take 17 g by mouth 2 (two) times daily. 60 each 3  . potassium chloride (K-DUR) 10 MEQ tablet Take 2 tablets once daily 180 tablet 3  . QUEtiapine (SEROQUEL) 200 MG tablet Take 1 tablet (200 mg total) by mouth at bedtime. 30 tablet 1  . sertraline (ZOLOFT) 100 MG tablet Take 1 tablet (100 mg total) by mouth daily. 30 tablet 1  . traZODone (DESYREL) 100 MG tablet Take 1 tablet (100 mg total) by mouth at bedtime. 1/2 - 1 tablet as needed 30 tablet 1   No current facility-administered  medications for this visit.     Review of Systems Review of Systems  Unable to perform ROS: Other  Patient declines to answer questions.   Blood pressure 132/89, pulse 76, temperature 97.7 F (36.5 C), temperature source Temporal, height 5\' 6"  (1.676 m), weight 191 lb 6.4 oz (86.8 kg), SpO2 97 %.  Physical Exam Physical Exam Exam conducted with a chaperone present.  Constitutional:      General: She is not in acute distress.    Appearance: She is obese.  HENT:     Nose: No congestion.     Mouth/Throat:     Mouth: Mucous membranes are moist.     Pharynx: No posterior oropharyngeal erythema.     Comments: White discoloration due to candy/mint.  Missing teeth. Eyes:     General: No scleral icterus.    Pupils: Pupils are equal, round, and reactive to light.  Neck:     Musculoskeletal: Normal range of motion.  Cardiovascular:     Rate and Rhythm: Normal rate and regular rhythm.  Pulmonary:     Effort: Pulmonary effort is normal.     Breath sounds: Normal breath sounds.  Abdominal:     General: Abdomen is flat.     Palpations: Abdomen is soft.  Genitourinary:    Exam position: Knee-chest position.       Comments: Multiple skin tags/piles present, primarily on the right ventral aspect of the anus.  No prolapse or mass appreciated on exam. Musculoskeletal:        General: Tenderness present.     Comments: Endorses tenderness to even light touch on her shoulders.  Lymphadenopathy:     Cervical: No cervical adenopathy.  Skin:    General: Skin is warm and dry.  Neurological:     General: No focal deficit present.     Mental Status: She is alert.  Psychiatric:     Comments: Extremely flat affect.     Data Reviewed ED visit from 01/2018. I reviewed Dr. Shann Medal note that describes an acute on chronic mildly symptomatic rectal prolapse versus less likely prolapse large nonthrombosed hemorrhoid.  The CT scan was also reviewed that describes suspected rectal prolapse.   Assessment At this time, there is no evidence of rectal prolapse.  She does have stigmata of prior hemorrhoidal disease without active disease present at this time.  There is no other mass appreciated on exam.  Plan No surgical intervention required.  She should continue to avoid becoming constipated to avoid exacerbating  hemorrhoidal disease or, if rectal prolapse was present previously, inciting a recurrence of this.  If she does develop nonreducible rectal prolapse, she really needs the services of a fellowship trained colon and rectal surgeon or a general surgeon who has extensive experience with treatment of rectal prolapse.  This will likely require her referral to either [facility] or one of the universities.  She may return to our clinic on an as-needed basis.    Fredirick Maudlin 09/11/2018, 2:56 PM

## 2018-09-11 NOTE — Patient Instructions (Signed)
Patient will need to return to the office as needed. Use feminine wipes to keep the rectal area clean after using the restroom. Follow up with your GYN.    Call the office with any questions or concerns.

## 2018-09-20 ENCOUNTER — Ambulatory Visit: Payer: Medicaid Other | Admitting: Internal Medicine

## 2018-09-20 DIAGNOSIS — I10 Essential (primary) hypertension: Secondary | ICD-10-CM

## 2018-09-20 DIAGNOSIS — N750 Cyst of Bartholin's gland: Secondary | ICD-10-CM

## 2018-09-20 NOTE — Progress Notes (Signed)
Subjective:    Patient ID: Meredith Juarez, female    DOB: 1961/01/24, 58 y.o.   MRN: [id number]  HPI  Patient is a 58 year old female who we spoke to over the phone @ 11:10 AM.   Patient claims that she is not feeling well. Did not provide any specific symptoms, she does not think she is running a fever.     Review of Systems  Patient Active Problem List   Diagnosis Date Noted  . 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 Residual hemorrhoidal skin tags 09/11/2018  . Fibroid uterus 04/05/2018  . Arthritis of ankle 12/28/2016  . Shoulder arthritis 12/28/2016  . H/O: stroke 12/27/2016  . Right sided weakness 12/27/2016  . Depression 12/27/2016  . GERD (gastroesophageal reflux disease) 11/07/2016  . Tobacco use disorder 11/07/2016  . Alcohol use disorder, severe, dependence ([place]) 11/07/2016  . Severe episode of recurrent major depressive disorder, without psychotic features ([place])   . Migraines 09/05/2014  . Hypertension 01/01/2014   Allergies as of 09/20/2018   No Known Allergies     Medication List       Accurate as of September 20, 2018 11:19 AM. Always use your most recent med list.        acetaminophen 500 MG tablet Commonly known as:  TYLENOL Take 500 mg by mouth every 4 (four) hours as needed.   amLODipine 10 MG tablet Commonly known as:  NORVASC Take 1 tablet (10 mg total) by mouth daily.   aspirin EC 81 MG tablet TAKE ONE TABLET BY MOUTH EVERY DAY   cetirizine 10 MG tablet Commonly known as:  ZYRTEC TAKE ONE TABLET BY MOUTH EVERY DAY   hydrochlorothiazide 25 MG tablet Commonly known as:  HYDRODIURIL Take 1 tablet (25 mg total) by mouth daily.   ibuprofen 600 MG tablet Commonly known as:  ADVIL,MOTRIN Take 1 tablet (600 mg total) by mouth every 8 (eight) hours as needed.   losartan 50 MG tablet Commonly known as:  COZAAR Take 1 tablet (50 mg total) by mouth daily.   metoprolol tartrate 50 MG tablet Commonly known as:  Lopressor Take 1 tablet (50 mg  total) by mouth daily.   nitroGLYCERIN 0.4 MG SL tablet Commonly known as:  Nitrostat Place 1 tablet (0.4 mg total) under the tongue every 5 (five) minutes as needed for chest pain.   polyethylene glycol packet Commonly known as:  MiraLax Take 17 g by mouth 2 (two) times daily.   potassium chloride 10 MEQ tablet Commonly known as:  K-DUR Take 2 tablets once daily   QUEtiapine 200 MG tablet Commonly known as:  SEROQUEL Take 1 tablet (200 mg total) by mouth at bedtime.   sertraline 100 MG tablet Commonly known as:  ZOLOFT Take 1 tablet (100 mg total) by mouth daily.   traZODone 100 MG tablet Commonly known as:  DESYREL Take 1 tablet (100 mg total) by mouth at bedtime. 1/2 - 1 tablet as needed          Objective:   Physical Exam  None- phone visit      Assessment & Plan:   1. Hypertension, unspecified type Patient claims to check her blood pressure at home and that the new medication Losartan has helped with improved BP readings. Pt did not provide any specific readings. Continue Losartan 50 mg.    2. 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 Will discuss at next visit.    RTE in May, no labs prior

## 2018-10-18 ENCOUNTER — Ambulatory Visit: Payer: Medicaid Other | Admitting: Internal Medicine

## 2018-10-18 DIAGNOSIS — I10 Essential (primary) hypertension: Secondary | ICD-10-CM

## 2018-10-18 DIAGNOSIS — E785 Hyperlipidemia, unspecified: Secondary | ICD-10-CM

## 2018-10-18 MED ORDER — LOSARTAN POTASSIUM 50 MG PO TABS: 50.0000 mg | ORAL_TABLET | Freq: Every day | ORAL | 3 refills | Status: AC

## 2018-10-18 MED ORDER — HYDROCHLOROTHIAZIDE 25 MG PO TABS: 25.0000 mg | ORAL_TABLET | Freq: Every day | ORAL | 3 refills | Status: AC

## 2018-10-18 MED ORDER — METOPROLOL TARTRATE 50 MG PO TABS: 50.0000 mg | ORAL_TABLET | Freq: Every day | ORAL | 3 refills | Status: AC

## 2018-10-18 MED ORDER — AMLODIPINE BESYLATE 10 MG PO TABS: 10.0000 mg | ORAL_TABLET | Freq: Every day | ORAL | 3 refills | Status: AC

## 2018-10-18 NOTE — Progress Notes (Signed)
Subjective:    Patient ID: Meredith Juarez, female    DOB: 1960-09-07, 58 y.o.   MRN: [id number]  HPI  Patient is a 58 year old female who presents telephonically for hypertension follow up. Patient claims that she has checked her BP at home and it is "fine." She doesn't remember the numbers. Patient states she needs BP medication refills.    Review of Systems  Patient Active Problem List   Diagnosis Date Noted  . 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 Residual hemorrhoidal skin tags 09/11/2018  . Fibroid uterus 04/05/2018  . Arthritis of ankle 12/28/2016  . Shoulder arthritis 12/28/2016  . H/O: stroke 12/27/2016  . Right sided weakness 12/27/2016  . Depression 12/27/2016  . GERD (gastroesophageal reflux disease) 11/07/2016  . Tobacco use disorder 11/07/2016  . Alcohol use disorder, severe, dependence ([place]) 11/07/2016  . Severe episode of recurrent major depressive disorder, without psychotic features ([place])   . Migraines 09/05/2014  . Hypertension 01/01/2014   Allergies as of 10/18/2018   No Known Allergies     Medication List       Accurate as of October 18, 2018 11:13 AM. Always use your most recent med list.        acetaminophen 500 MG tablet Commonly known as:  TYLENOL Take 500 mg by mouth every 4 (four) hours as needed.   amLODipine 10 MG tablet Commonly known as:  NORVASC Take 1 tablet (10 mg total) by mouth daily.   aspirin EC 81 MG tablet TAKE ONE TABLET BY MOUTH EVERY DAY   cetirizine 10 MG tablet Commonly known as:  ZYRTEC TAKE ONE TABLET BY MOUTH EVERY DAY   hydrochlorothiazide 25 MG tablet Commonly known as:  HYDRODIURIL Take 1 tablet (25 mg total) by mouth daily.   ibuprofen 600 MG tablet Commonly known as:  ADVIL Take 1 tablet (600 mg total) by mouth every 8 (eight) hours as needed.   losartan 50 MG tablet Commonly known as:  COZAAR Take 1 tablet (50 mg total) by mouth daily.   metoprolol tartrate 50 MG tablet Commonly known as:  Lopressor  Take 1 tablet (50 mg total) by mouth daily.   nitroGLYCERIN 0.4 MG SL tablet Commonly known as:  Nitrostat Place 1 tablet (0.4 mg total) under the tongue every 5 (five) minutes as needed for chest pain.   polyethylene glycol 17 g packet Commonly known as:  MiraLax Take 17 g by mouth 2 (two) times daily.   potassium chloride 10 MEQ tablet Commonly known as:  K-DUR Take 2 tablets once daily   QUEtiapine 200 MG tablet Commonly known as:  SEROQUEL Take 1 tablet (200 mg total) by mouth at bedtime.   sertraline 100 MG tablet Commonly known as:  ZOLOFT Take 1 tablet (100 mg total) by mouth daily.   traZODone 100 MG tablet Commonly known as:  DESYREL Take 1 tablet (100 mg total) by mouth at bedtime. 1/2 - 1 tablet as needed          Objective:   Physical Exam  Telephone Visit       Assessment & Plan:   1. Hypertension, unspecified type No new symptoms or concerns stated at this time. Patient advised to keep record of her at home BP checks for future visit.   Refill Today:  - losartan (COZAAR) 50 MG tablet; Take 1 tablet (50 mg total) by mouth daily.  Dispense: 90 tablet; Refill: 3 - amLODipine (NORVASC) 10 MG tablet; Take 1 tablet (10 mg  total) by mouth daily.  Dispense: 90 tablet; Refill: 3 - metoprolol tartrate (LOPRESSOR) 50 MG tablet; Take 1 tablet (50 mg total) by mouth daily.  Dispense: 90 tablet; Refill: 3 - hydrochlorothiazide (HYDRODIURIL) 25 MG tablet; Take 1 tablet (25 mg total) by mouth daily.  Dispense: 90 tablet; Refill: 3  Check in 3 Months:  - CBC; Future - Comprehensive metabolic panel; Future - UA/M w/rflx Culture, Routine; Future  2. Hyperlipidemia, unspecified hyperlipidemia type  Check in 3 Months:  - Lipid panel; Future - TSH; Future   RTC in 3 months

## 2019-01-10 ENCOUNTER — Other Ambulatory Visit: Payer: Self-pay

## 2019-01-17 ENCOUNTER — Ambulatory Visit: Payer: Self-pay | Admitting: Internal Medicine

## 2019-05-03 ENCOUNTER — Telehealth: Payer: Self-pay

## 2019-05-03 NOTE — Telephone Encounter (Signed)
Patient Meredith Juarez needing refill of medications. She needs to reschedule missed appt in July with Dr Mable Fill. Unable to Meredith Juarez for patient, mailbox is full at 1:49pm 05/03/2019.

## 2019-08-14 ENCOUNTER — Telehealth: Payer: Self-pay | Admitting: Gerontology

## 2019-08-14 NOTE — Telephone Encounter (Signed)
Pt was mailed her recertification packet a few weeks ago. [place] received the letter back the week of 2/8 with a 'return to sender, not deliverable as addressed, unable to forward' notice on letter. Will need to call patient and see if there an updated address

## 2019-09-13 ENCOUNTER — Telehealth: Payer: Self-pay | Admitting: General Practice

## 2019-09-13 NOTE — Telephone Encounter (Signed)
I attempted to call pt on 3/18 at 10:51 am to get a forwarding address after some mail bounced back to [facility]. We have been attempting to contact pt since Feb but have been unable to contact. No voicemails have been left due to the vm box being full.

## 2019-10-16 ENCOUNTER — Telehealth: Payer: Self-pay | Admitting: General Practice

## 2019-10-16 NOTE — Telephone Encounter (Signed)
Attempted to mail updated eligibility paperwork, incorrect mailing address. Voicemail messages were left in February and March. Pt also has active Medicaid.

## 2020-10-14 ENCOUNTER — Encounter: Payer: Self-pay | Admitting: Obstetrics and Gynecology

## 2021-01-17 ENCOUNTER — Encounter: Payer: Self-pay | Admitting: Nurse Practitioner

## 2021-01-17 DIAGNOSIS — F1491 Cocaine use, unspecified, in remission: Secondary | ICD-10-CM | POA: Insufficient documentation

## 2021-01-17 DIAGNOSIS — R7309 Other abnormal glucose: Secondary | ICD-10-CM | POA: Insufficient documentation

## 2021-01-17 DIAGNOSIS — Z87898 Personal history of other specified conditions: Secondary | ICD-10-CM | POA: Insufficient documentation

## 2021-01-21 ENCOUNTER — Ambulatory Visit: Payer: Medicaid Other | Admitting: Nurse Practitioner

## 2021-02-11 ENCOUNTER — Emergency Department
Admission: EM | Admit: 2021-02-11 | Discharge: 2021-02-11 | Disposition: A | Discharge status: Home / Self Care | Payer: Medicaid Other | Attending: Emergency Medicine | Admitting: Emergency Medicine

## 2021-02-11 ENCOUNTER — Emergency Department: Payer: Medicaid Other

## 2021-02-11 ENCOUNTER — Other Ambulatory Visit: Payer: Self-pay

## 2021-02-11 ENCOUNTER — Encounter: Payer: Self-pay | Admitting: Emergency Medicine

## 2021-02-11 DIAGNOSIS — K625 Hemorrhage of anus and rectum: Secondary | ICD-10-CM | POA: Diagnosis not present

## 2021-02-11 DIAGNOSIS — R55 Syncope and collapse: Secondary | ICD-10-CM | POA: Diagnosis not present

## 2021-02-11 DIAGNOSIS — R1084 Generalized abdominal pain: Secondary | ICD-10-CM

## 2021-02-11 DIAGNOSIS — R109 Unspecified abdominal pain: Secondary | ICD-10-CM | POA: Diagnosis present

## 2021-02-11 DIAGNOSIS — Z87891 Personal history of nicotine dependence: Secondary | ICD-10-CM | POA: Diagnosis not present

## 2021-02-11 DIAGNOSIS — Z79899 Other long term (current) drug therapy: Secondary | ICD-10-CM | POA: Insufficient documentation

## 2021-02-11 DIAGNOSIS — N939 Abnormal uterine and vaginal bleeding, unspecified: Secondary | ICD-10-CM | POA: Insufficient documentation

## 2021-02-11 DIAGNOSIS — Z7982 Long term (current) use of aspirin: Secondary | ICD-10-CM | POA: Diagnosis not present

## 2021-02-11 DIAGNOSIS — I10 Essential (primary) hypertension: Secondary | ICD-10-CM | POA: Insufficient documentation

## 2021-02-11 DIAGNOSIS — K219 Gastro-esophageal reflux disease without esophagitis: Secondary | ICD-10-CM | POA: Diagnosis not present

## 2021-02-11 LAB — COMPREHENSIVE METABOLIC PANEL
ALT: 11 U/L (ref 0–44)
AST: 16 U/L (ref 15–41)
Albumin: 4.1 g/dL (ref 3.5–5.0)
Alkaline Phosphatase: 51 U/L (ref 38–126)
Anion gap: 10 (ref 5–15)
BUN: 12 mg/dL (ref 6–20)
CO2: 23 mmol/L (ref 22–32)
Calcium: 9.8 mg/dL (ref 8.9–10.3)
Chloride: 101 mmol/L (ref 98–111)
Creatinine, Ser: 1 mg/dL (ref 0.44–1.00)
GFR, Estimated: >60 mL/min (ref >60)
Glucose, Bld: 92 mg/dL (ref 70–99)
Potassium: 4 mmol/L (ref 3.5–5.1)
Sodium: 134 mmol/L — ABNORMAL LOW (ref 135–145)
Total Bilirubin: 1 mg/dL (ref 0.3–1.2)
Total Protein: 7.4 g/dL (ref 6.5–8.1)

## 2021-02-11 LAB — TYPE AND SCREEN
ABO/RH(D): O POS
Antibody Screen: NEGATIVE

## 2021-02-11 LAB — CBC
HCT: 36 % (ref 36.0–46.0)
Hemoglobin: 12 g/dL (ref 12.0–15.0)
MCH: 24 pg — ABNORMAL LOW (ref 26.0–34.0)
MCHC: 33.3 g/dL (ref 30.0–36.0)
MCV: 72 fL — ABNORMAL LOW (ref 80.0–100.0)
Platelets: 389 10*3/uL (ref 150–400)
RBC: 5 MIL/uL (ref 3.87–5.11)
RDW: 18.7 % — ABNORMAL HIGH (ref 11.5–15.5)
WBC: 8.7 10*3/uL (ref 4.0–10.5)
nRBC: 0 % (ref 0.0–0.2)

## 2021-02-11 MED ORDER — IOHEXOL 350 MG/ML SOLN
80.0000 mL | Freq: Once | INTRAVENOUS | Status: AC | PRN
  Administered 2021-02-11: 80 mL via INTRAVENOUS

## 2021-02-11 NOTE — ED Provider Notes (Signed)
Patient doing well currently she is not lightheaded she is very hungry and asked to eat.  I discussed her case with Dr. Aline Brochure her OB/GYN from [facility] where she goes.  He will follow her up in the office within 48 hours.  She is not lightheaded she is not anemic she is not having any pain currently.  She is happy with this.   Meredith Polio, MD 02/11/21 1622

## 2021-02-11 NOTE — ED Provider Notes (Signed)
[facility] Emergency Department Provider Note  Time seen: 1:44 PM  I have reviewed the triage vital signs and the nursing notes.   HISTORY  Chief Complaint Rectal Bleeding, Abdominal Pain, and Loss of Consciousness   HPI Meredith Juarez is a 60 y.o. female with a past medical history of anemia, depression, gastric reflux, hypertension, presents to the emergency department for multiple complaints.  According to the patient over the past 2 weeks she has been feeling dizzy at times, and believes 2 weeks ago she passed out and possibly hit her head.  Patient states she has been experiencing abdominal pain over the past 2 weeks of the left abdomen.  Patient states intermittent rectal bleeding x3 or 4 years but no worse today.  Does state intermittent vaginal bleeding as well for the past 2 to 3 years has not followed up with a GYN.  Patient states intermittent diarrhea but also intermittent constipation.  Denies any vomiting.  Patient's main complaint appears to be left-sided abdominal pain which she says is moderate aching type pain.   Past Medical History:  Diagnosis Date   Anemia    Arthritis    Depression    Diverticulitis    GERD (gastroesophageal reflux disease)    Hypertension    Migraines    Stroke [facility])    Thyroid disease     Patient Active Problem List   Diagnosis Date Noted   Elevated hemoglobin A1c measurement 01/17/2021   History of cocaine use 01/17/2021   Bartholin's gland cyst 09/20/2018   Fibroid uterus 04/05/2018   Arthritis 12/28/2016   H/O: stroke 12/27/2016   GERD (gastroesophageal reflux disease) 11/07/2016   Nicotine dependence, cigarettes, uncomplicated [id number]   Alcohol use disorder, severe, dependence ([place]) 11/07/2016   Severe episode of recurrent major depressive disorder, without psychotic features ([place])    Migraines 09/05/2014   Hypertension 01/01/2014    Past Surgical History:  Procedure Laterality Date   NO PAST  SURGERIES      Prior to Admission medications   Medication Sig Start Date End Date Taking? Authorizing Provider  acetaminophen (TYLENOL) 500 MG tablet Take 500 mg by mouth every 4 (four) hours as needed.    [provider]  amLODipine (NORVASC) 10 MG tablet Take 1 tablet (10 mg total) by mouth daily. 10/18/18   Tawni Millers, MD  aspirin EC 81 MG tablet TAKE ONE TABLET BY MOUTH EVERY DAY 03/07/18   Tawni Millers, MD  cetirizine (ZYRTEC) 10 MG tablet TAKE ONE TABLET BY MOUTH EVERY DAY 09/12/18   Tawni Millers, MD  hydrochlorothiazide (HYDRODIURIL) 25 MG tablet Take 1 tablet (25 mg total) by mouth daily. 10/18/18   Tawni Millers, MD  ibuprofen (ADVIL,MOTRIN) 600 MG tablet Take 1 tablet (600 mg total) by mouth every 8 (eight) hours as needed. 04/05/18   Tawni Millers, MD  losartan (COZAAR) 50 MG tablet Take 1 tablet (50 mg total) by mouth daily. 10/18/18   Tawni Millers, MD  metoprolol tartrate (LOPRESSOR) 50 MG tablet Take 1 tablet (50 mg total) by mouth daily. 10/18/18 10/18/19  Tawni Millers, MD  nitroGLYCERIN (NITROSTAT) 0.4 MG SL tablet Place 1 tablet (0.4 mg total) under the tongue every 5 (five) minutes as needed for chest pain. 04/05/18   Tawni Millers, MD  polyethylene glycol [facility]) packet Take 17 g by mouth 2 (two) times daily. 04/05/18   Tawni Millers, MD  potassium chloride (K-DUR) 10 MEQ tablet Take 2  tablets once daily 04/05/18   Tawni Millers, MD  QUEtiapine (SEROQUEL) 200 MG tablet Take 1 tablet (200 mg total) by mouth at bedtime. 11/09/16   Pucilowska, Herma Ard B, MD  sertraline (ZOLOFT) 100 MG tablet Take 1 tablet (100 mg total) by mouth daily. 11/09/16   Pucilowska, Herma Ard B, MD  traZODone (DESYREL) 100 MG tablet Take 1 tablet (100 mg total) by mouth at bedtime. 1/2 - 1 tablet as needed 11/09/16   Pucilowska, Jolanta B, MD    No Known Allergies  Family History  Problem Relation Age of Onset   Cancer Mother        Uterine, Breast, Ovarian   Hypertension Mother     Breast cancer Mother    Hypertension Father    Bipolar disorder Father    CAD Sister    Hyperlipidemia Sister     Social History Social History   Tobacco Use   Smoking status: Former    Packs/day: 0.25    Types: Cigarettes   Smokeless tobacco: Never  Vaping Use   Vaping Use: Never used  Substance Use Topics   Alcohol use: Not Currently   Drug use: No    Review of Systems Constitutional: Negative for fever.  Intermittent dizziness. Cardiovascular: Negative for chest pain. Respiratory: Negative for shortness of breath. Gastrointestinal: Left-sided abdominal pain x2 weeks.  Intermittent constipation/diarrhea.  Intermittent rectal bleeding times years but no worse. Genitourinary: Negative for urinary compaints.  States intermittent vaginal bleeding times years. Musculoskeletal: Negative for musculoskeletal complaints Neurological: Negative for headache.  Denies weakness or numbness. All other ROS negative  ____________________________________________   PHYSICAL EXAM:  VITAL SIGNS: ED Triage Vitals  Enc Vitals Group     BP 02/11/21 1041 (!) 173/90     Pulse Rate 02/11/21 1041 84     Resp 02/11/21 1041 18     Temp 02/11/21 1041 98.4 F (36.9 C)     Temp Source 02/11/21 1041 Oral     SpO2 02/11/21 1041 100 %     Weight 02/11/21 1037 191 lb 5.8 oz (86.8 kg)     Height 02/11/21 1037 '5\' 6"'$  (1.676 m)     Head Circumference --      Peak Flow --      Pain Score 02/11/21 1037 6     Pain Loc --      Pain Edu? --      Excl. in [place]? --    Constitutional: Alert and oriented. Well appearing and in no distress. Eyes: Normal exam ENT      Head: Normocephalic and atraumatic.      Mouth/Throat: Mucous membranes are moist. Cardiovascular: Normal rate, regular rhythm.  Respiratory: Normal respiratory effort without tachypnea nor retractions. Breath sounds are clear Gastrointestinal: Soft abdomen however there is a palpated mass to the left lower quadrant, mild tenderness to this  area.  No rebound guarding or distention. Musculoskeletal: Nontender with normal range of motion in all extremities.  Neurologic:  Normal speech and language. No gross focal neurologic deficits  Skin:  Skin is warm, dry and intact.  Psychiatric: Mood and affect are normal.  ____________________________________________    EKG  EKG viewed and interpreted by myself shows a normal sinus rhythm at 80 bpm with a narrow QRS, normal axis, normal intervals, no concerning ST changes.  ____________________________________________    RADIOLOGY  CT/ultrasound pending  ____________________________________________   INITIAL IMPRESSION / ASSESSMENT AND PLAN / ED COURSE  Pertinent labs & imaging results that were available during  my care of the patient were reviewed by me and considered in my medical decision making (see chart for details).   Patient presents to the emergency department for abdominal pain for the past 2 weeks, intermittent dizziness, possible syncopal episode 2 weeks ago, intermittent rectal bleeding times years, intermittent vaginal bleeding times years.  Rectal examination shows large hemorrhoids but stool is light brown and guaiac negative.  No obvious vaginal bleeding noted during my evaluation, we will obtain a pelvic ultrasound to further evaluate.  I did discuss with the patient she will need to follow-up with a GYN has vaginal bleeding at her age could be indicative of a cancerous process.  Given the patient's left-sided abdominal pain with palpated left lower quadrant mass we will obtain CT imaging to further evaluate, differential could include large fibroids, intra-abdominal mass, hernia.  Patient's lab work is largely nonrevealing, reassuringly normal H&H.  Urinalysis is pending.  Meredith Juarez was evaluated in Emergency Department on 02/11/2021 for the symptoms described in the history of present illness. She was evaluated in the context of the global COVID-19 pandemic,  which necessitated consideration that the patient might be at risk for infection with the SARS-CoV-2 virus that causes COVID-19. Institutional protocols and algorithms that pertain to the evaluation of patients at risk for COVID-19 are in a state of rapid change based on information released by regulatory bodies including the CDC and federal and state organizations. These policies and algorithms were followed during the patient's care in the ED.  ____________________________________________   FINAL CLINICAL IMPRESSION(S) / ED DIAGNOSES  Left-sided abdominal pain.  Rectal bleeding Vaginal bleeding   Harvest Dark, MD 02/18/21 2007

## 2021-02-11 NOTE — ED Triage Notes (Signed)
Pt comes into the ED via POV c/o rectal bleeding, abd pain, dizziness, and syncopal episode.  Pt states the bleeding started before everything else.  Pt states she has severe cramping in her stomach at times.  Pt states she has had all these symptoms over 2 weeks, and 2 weeks ago she finally got dizzy, had LOC, and hit her head.  Pt currently ambulatory to triage with even and unlabored respirations and pink moist mucous membranes.

## 2021-02-11 NOTE — Discharge Instructions (Addendum)
Please follow-up with gynecology by calling the number provided to arrange a follow-up appointment regarding your intermittent vaginal bleeding.  I discussed you with Dr. Barnett Applebaum who is on-call for OB/GYN.  He will work to get you in in the next 48 hours or so if you call them in the morning.  Please return for any further weakness or lightheadedness or heavy bleeding.

## 2021-04-06 ENCOUNTER — Encounter: Payer: Self-pay | Admitting: General Surgery
